# Patient Record
Sex: Female | Born: 1970 | Race: White | Hispanic: No | Marital: Married | Smoking: Never smoker
Health system: Southern US, Community
[De-identification: ages and names within clinical notes are randomized; demographics above are authoritative.]

## PROBLEM LIST (undated history)

## (undated) DIAGNOSIS — G47419 Narcolepsy without cataplexy: Secondary | ICD-10-CM

## (undated) DIAGNOSIS — F32A Depression, unspecified: Secondary | ICD-10-CM

## (undated) DIAGNOSIS — S0300XA Dislocation of jaw, unspecified side, initial encounter: Secondary | ICD-10-CM

## (undated) DIAGNOSIS — E669 Obesity, unspecified: Secondary | ICD-10-CM

## (undated) DIAGNOSIS — L709 Acne, unspecified: Secondary | ICD-10-CM

## (undated) DIAGNOSIS — N200 Calculus of kidney: Secondary | ICD-10-CM

## (undated) DIAGNOSIS — F419 Anxiety disorder, unspecified: Secondary | ICD-10-CM

## (undated) DIAGNOSIS — Z8489 Family history of other specified conditions: Secondary | ICD-10-CM

## (undated) DIAGNOSIS — L539 Erythematous condition, unspecified: Secondary | ICD-10-CM

## (undated) DIAGNOSIS — N393 Stress incontinence (female) (male): Secondary | ICD-10-CM

## (undated) DIAGNOSIS — E039 Hypothyroidism, unspecified: Secondary | ICD-10-CM

## (undated) DIAGNOSIS — F329 Major depressive disorder, single episode, unspecified: Secondary | ICD-10-CM

## (undated) DIAGNOSIS — I8390 Asymptomatic varicose veins of unspecified lower extremity: Secondary | ICD-10-CM

## (undated) DIAGNOSIS — L680 Hirsutism: Secondary | ICD-10-CM

## (undated) DIAGNOSIS — R61 Generalized hyperhidrosis: Secondary | ICD-10-CM

## (undated) DIAGNOSIS — Q892 Congenital malformations of other endocrine glands: Secondary | ICD-10-CM

## (undated) DIAGNOSIS — L920 Granuloma annulare: Secondary | ICD-10-CM

## (undated) HISTORY — DX: Depression, unspecified: F32.A

## (undated) HISTORY — DX: Granuloma annulare: L92.0

## (undated) HISTORY — DX: Narcolepsy without cataplexy: G47.419

## (undated) HISTORY — DX: Obesity, unspecified: E66.9

## (undated) HISTORY — DX: Major depressive disorder, single episode, unspecified: F32.9

## (undated) HISTORY — DX: Erythematous condition, unspecified: L53.9

## (undated) HISTORY — PX: KIDNEY STONE SURGERY: SHX686

## (undated) HISTORY — DX: Calculus of kidney: N20.0

## (undated) HISTORY — DX: Dislocation of jaw, unspecified side, initial encounter: S03.00XA

## (undated) HISTORY — DX: Hypothyroidism, unspecified: E03.9

## (undated) HISTORY — DX: Acne, unspecified: L70.9

## (undated) HISTORY — DX: Anxiety disorder, unspecified: F41.9

## (undated) HISTORY — DX: Asymptomatic varicose veins of unspecified lower extremity: I83.90

## (undated) HISTORY — DX: Generalized hyperhidrosis: R61

## (undated) HISTORY — DX: Congenital malformations of other endocrine glands: Q89.2

## (undated) HISTORY — DX: Stress incontinence (female) (male): N39.3

## (undated) HISTORY — DX: Hirsutism: L68.0

## (undated) SURGERY — Surgical Case
Anesthesia: *Unknown

---
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
## 1998-03-23 HISTORY — PX: OTHER SURGICAL HISTORY: SHX169

## 1998-12-07 ENCOUNTER — Emergency Department (HOSPITAL_COMMUNITY): Admission: EM | Admit: 1998-12-07 | Discharge: 1998-12-07 | Payer: Self-pay | Admitting: Emergency Medicine

## 1999-01-14 ENCOUNTER — Encounter: Payer: Self-pay | Admitting: Obstetrics and Gynecology

## 1999-01-14 ENCOUNTER — Ambulatory Visit (HOSPITAL_COMMUNITY): Admission: RE | Admit: 1999-01-14 | Discharge: 1999-01-14 | Payer: Self-pay | Admitting: Obstetrics and Gynecology

## 1999-01-21 ENCOUNTER — Encounter (INDEPENDENT_AMBULATORY_CARE_PROVIDER_SITE_OTHER): Payer: Self-pay | Admitting: Specialist

## 1999-01-21 ENCOUNTER — Other Ambulatory Visit: Admission: RE | Admit: 1999-01-21 | Discharge: 1999-01-21 | Payer: Self-pay | Admitting: Obstetrics and Gynecology

## 1999-04-23 ENCOUNTER — Encounter: Admission: RE | Admit: 1999-04-23 | Discharge: 1999-04-23 | Payer: Self-pay | Admitting: Family Medicine

## 1999-04-23 ENCOUNTER — Encounter: Payer: Self-pay | Admitting: Family Medicine

## 1999-04-29 ENCOUNTER — Encounter: Payer: Self-pay | Admitting: Family Medicine

## 1999-04-29 ENCOUNTER — Encounter: Admission: RE | Admit: 1999-04-29 | Discharge: 1999-04-29 | Payer: Self-pay | Admitting: Family Medicine

## 1999-06-13 ENCOUNTER — Ambulatory Visit (HOSPITAL_COMMUNITY): Admission: RE | Admit: 1999-06-13 | Discharge: 1999-06-13 | Payer: Self-pay | Admitting: Gastroenterology

## 1999-06-13 ENCOUNTER — Encounter: Payer: Self-pay | Admitting: Gastroenterology

## 1999-06-25 ENCOUNTER — Other Ambulatory Visit: Admission: RE | Admit: 1999-06-25 | Discharge: 1999-06-25 | Payer: Self-pay | Admitting: Otolaryngology

## 1999-11-11 ENCOUNTER — Encounter: Payer: Self-pay | Admitting: Obstetrics and Gynecology

## 1999-11-11 ENCOUNTER — Ambulatory Visit (HOSPITAL_COMMUNITY): Admission: RE | Admit: 1999-11-11 | Discharge: 1999-11-11 | Payer: Self-pay | Admitting: Obstetrics and Gynecology

## 2000-03-02 ENCOUNTER — Inpatient Hospital Stay (HOSPITAL_COMMUNITY): Admission: RE | Admit: 2000-03-02 | Discharge: 2000-03-02 | Payer: Self-pay | Admitting: Obstetrics and Gynecology

## 2000-03-02 ENCOUNTER — Encounter: Payer: Self-pay | Admitting: Obstetrics and Gynecology

## 2000-03-09 ENCOUNTER — Encounter (HOSPITAL_COMMUNITY): Admission: RE | Admit: 2000-03-09 | Discharge: 2000-04-02 | Payer: Self-pay | Admitting: Obstetrics and Gynecology

## 2000-04-02 ENCOUNTER — Inpatient Hospital Stay (HOSPITAL_COMMUNITY): Admission: AD | Admit: 2000-04-02 | Discharge: 2000-04-06 | Payer: Self-pay | Admitting: Obstetrics and Gynecology

## 2000-05-13 ENCOUNTER — Other Ambulatory Visit: Admission: RE | Admit: 2000-05-13 | Discharge: 2000-05-13 | Payer: Self-pay | Admitting: Obstetrics and Gynecology

## 2001-04-03 ENCOUNTER — Inpatient Hospital Stay (HOSPITAL_COMMUNITY): Admission: AD | Admit: 2001-04-03 | Discharge: 2001-04-03 | Payer: Self-pay | Admitting: Obstetrics and Gynecology

## 2001-04-07 ENCOUNTER — Encounter: Payer: Self-pay | Admitting: Obstetrics and Gynecology

## 2001-04-07 ENCOUNTER — Ambulatory Visit (HOSPITAL_COMMUNITY): Admission: RE | Admit: 2001-04-07 | Discharge: 2001-04-07 | Payer: Self-pay | Admitting: Obstetrics and Gynecology

## 2001-05-31 ENCOUNTER — Encounter: Payer: Self-pay | Admitting: Family Medicine

## 2001-05-31 ENCOUNTER — Encounter: Admission: RE | Admit: 2001-05-31 | Discharge: 2001-05-31 | Payer: Self-pay | Admitting: Family Medicine

## 2002-03-23 HISTORY — PX: TONSILLECTOMY: SUR1361

## 2002-07-24 ENCOUNTER — Other Ambulatory Visit: Admission: RE | Admit: 2002-07-24 | Discharge: 2002-07-24 | Payer: Self-pay | Admitting: Obstetrics and Gynecology

## 2002-08-10 ENCOUNTER — Encounter: Payer: Self-pay | Admitting: Otolaryngology

## 2002-08-10 ENCOUNTER — Encounter: Admission: RE | Admit: 2002-08-10 | Discharge: 2002-08-10 | Payer: Self-pay | Admitting: Otolaryngology

## 2002-08-22 DIAGNOSIS — Q892 Congenital malformations of other endocrine glands: Secondary | ICD-10-CM

## 2002-08-22 HISTORY — DX: Congenital malformations of other endocrine glands: Q89.2

## 2002-09-13 ENCOUNTER — Inpatient Hospital Stay (HOSPITAL_COMMUNITY): Admission: AD | Admit: 2002-09-13 | Discharge: 2002-09-16 | Payer: Self-pay | Admitting: Otolaryngology

## 2003-09-08 ENCOUNTER — Inpatient Hospital Stay (HOSPITAL_COMMUNITY): Admission: AD | Admit: 2003-09-08 | Discharge: 2003-09-08 | Payer: Self-pay | Admitting: Obstetrics and Gynecology

## 2003-09-12 ENCOUNTER — Inpatient Hospital Stay (HOSPITAL_COMMUNITY): Admission: AD | Admit: 2003-09-12 | Discharge: 2003-09-14 | Payer: Self-pay | Admitting: Obstetrics and Gynecology

## 2003-09-18 ENCOUNTER — Encounter: Admission: RE | Admit: 2003-09-18 | Discharge: 2003-10-18 | Payer: Self-pay | Admitting: Obstetrics and Gynecology

## 2004-01-17 ENCOUNTER — Other Ambulatory Visit: Admission: RE | Admit: 2004-01-17 | Discharge: 2004-01-17 | Payer: Self-pay | Admitting: Family Medicine

## 2005-04-21 ENCOUNTER — Other Ambulatory Visit: Admission: RE | Admit: 2005-04-21 | Discharge: 2005-04-21 | Payer: Self-pay | Admitting: Obstetrics and Gynecology

## 2005-05-07 ENCOUNTER — Encounter: Admission: RE | Admit: 2005-05-07 | Discharge: 2005-05-07 | Payer: Self-pay | Admitting: Obstetrics and Gynecology

## 2007-05-04 ENCOUNTER — Other Ambulatory Visit: Admission: RE | Admit: 2007-05-04 | Discharge: 2007-05-04 | Payer: Self-pay | Admitting: Family Medicine

## 2008-03-23 HISTORY — PX: VAGINAL PROLAPSE REPAIR: SHX830

## 2008-03-23 HISTORY — PX: ABDOMINAL HYSTERECTOMY: SHX81

## 2008-05-31 ENCOUNTER — Other Ambulatory Visit: Admission: RE | Admit: 2008-05-31 | Discharge: 2008-05-31 | Payer: Self-pay | Admitting: Family Medicine

## 2008-12-26 ENCOUNTER — Encounter (INDEPENDENT_AMBULATORY_CARE_PROVIDER_SITE_OTHER): Payer: Self-pay | Admitting: Obstetrics and Gynecology

## 2008-12-26 ENCOUNTER — Ambulatory Visit (HOSPITAL_COMMUNITY): Admission: RE | Admit: 2008-12-26 | Discharge: 2008-12-27 | Payer: Self-pay | Admitting: Obstetrics and Gynecology

## 2009-03-23 HISTORY — PX: OTHER SURGICAL HISTORY: SHX169

## 2009-03-26 ENCOUNTER — Encounter: Admission: RE | Admit: 2009-03-26 | Discharge: 2009-03-26 | Payer: Self-pay | Admitting: Obstetrics and Gynecology

## 2009-07-02 ENCOUNTER — Encounter: Admission: RE | Admit: 2009-07-02 | Discharge: 2009-07-02 | Payer: Self-pay | Admitting: Internal Medicine

## 2010-03-29 ENCOUNTER — Emergency Department (HOSPITAL_COMMUNITY)
Admission: EM | Admit: 2010-03-29 | Discharge: 2010-03-29 | Payer: Self-pay | Source: Home / Self Care | Admitting: Emergency Medicine

## 2010-04-07 LAB — URINE MICROSCOPIC-ADD ON

## 2010-04-07 LAB — URINALYSIS, ROUTINE W REFLEX MICROSCOPIC
Bilirubin Urine: NEGATIVE
Ketones, ur: NEGATIVE mg/dL
Leukocytes, UA: NEGATIVE
Nitrite: NEGATIVE
Protein, ur: 30 mg/dL — AB
Specific Gravity, Urine: 1.02 (ref 1.005–1.030)
Urine Glucose, Fasting: NEGATIVE mg/dL
Urobilinogen, UA: 0.2 mg/dL (ref 0.0–1.0)
pH: 7 (ref 5.0–8.0)

## 2010-04-07 LAB — URINE CULTURE
Colony Count: NO GROWTH
Culture  Setup Time: 201201071114
Culture: NO GROWTH

## 2010-04-07 LAB — GLUCOSE, CAPILLARY: Glucose-Capillary: 122 mg/dL — ABNORMAL HIGH (ref 70–99)

## 2010-04-13 ENCOUNTER — Encounter: Payer: Self-pay | Admitting: Internal Medicine

## 2010-06-26 LAB — CBC
HCT: 31.3 % — ABNORMAL LOW (ref 36.0–46.0)
HCT: 31.3 % — ABNORMAL LOW (ref 36.0–46.0)
HCT: 36.3 % (ref 36.0–46.0)
Hemoglobin: 10.7 g/dL — ABNORMAL LOW (ref 12.0–15.0)
Hemoglobin: 10.9 g/dL — ABNORMAL LOW (ref 12.0–15.0)
Hemoglobin: 12.2 g/dL (ref 12.0–15.0)
MCHC: 33.5 g/dL (ref 30.0–36.0)
MCHC: 34.1 g/dL (ref 30.0–36.0)
MCHC: 34.8 g/dL (ref 30.0–36.0)
MCV: 90.8 fL (ref 78.0–100.0)
MCV: 91.1 fL (ref 78.0–100.0)
MCV: 91.3 fL (ref 78.0–100.0)
Platelets: 190 10*3/uL (ref 150–400)
Platelets: 219 10*3/uL (ref 150–400)
Platelets: 240 10*3/uL (ref 150–400)
RBC: 3.43 MIL/uL — ABNORMAL LOW (ref 3.87–5.11)
RBC: 3.43 MIL/uL — ABNORMAL LOW (ref 3.87–5.11)
RBC: 4 MIL/uL (ref 3.87–5.11)
RDW: 13.1 % (ref 11.5–15.5)
RDW: 13.2 % (ref 11.5–15.5)
RDW: 13.3 % (ref 11.5–15.5)
WBC: 3.7 10*3/uL — ABNORMAL LOW (ref 4.0–10.5)
WBC: 8.2 10*3/uL (ref 4.0–10.5)
WBC: 9.4 10*3/uL (ref 4.0–10.5)

## 2010-06-26 LAB — TYPE AND SCREEN
ABO/RH(D): AB POS
Antibody Screen: NEGATIVE

## 2010-06-26 LAB — ABO/RH: ABO/RH(D): AB POS

## 2010-08-05 ENCOUNTER — Ambulatory Visit: Payer: Self-pay | Admitting: Internal Medicine

## 2010-08-08 NOTE — Op Note (Signed)
[facility]  Patient:    Vanessa Blanchard, Vanessa Blanchard                       MRN: [id number] Proc. Date: 04/03/00 Adm. Date:  [id number] Disc. Date: [id number] Attending:  Michaele Offer                           Operative Report  PREOPERATIVE DIAGNOSES:       1. Term pregnancy at 38 weeks, delivered.                               2. Polyhydramnios.                               3. Large for gestational age fetus.                               4. Failed induction.                               5. Arrest of descent.                               6. Maternal temperature in labor.  POSTOPERATIVE DIAGNOSES:      1. Term pregnancy at 38 weeks, delivered.                               2. Polyhydramnios.                               3. Large for gestational age fetus.                               4. Failed induction.                               5. Arrest of descent.                               6. Maternal temperature in labor.  OPERATION:                    Primary low transverse cesarean section.  SURGEON:                      Alvino Chapel, M.D.  ASSISTANT:  ANESTHESIA:                   Epidural.  ESTIMATED BLOOD LOSS:         800 cc.  URINE OUTPUT:                 500 cc.  IV FLUIDS:                    1200 cc LR.  FINDINGS:  There was a vigorous female infant who was vertex but had a transverse lie of the head. Weight was 8 pounds 2 ounces. Apgars were 9 and 10. Placenta was normal. Uterus, tubes, and ovaries were normal.  DESCRIPTION OF PROCEDURE:     Patient was taken to the operating room where epidural anesthesia was made adequate with additional dosing and tested with Allis clamp test. A Pfannenstiel skin incision was then made approximately 2 cm above the symphysis pubis and carried through to the underlying layer of fascia. The fascia was then nicked in the midline and the fascial incision elevated, both  superiorly and inferiorly, off the rectus muscles and dissected away with Bovie cautery. The rectus muscles were then separated in midline and the peritoneum identified, grasped with hemostats x 2, and entered sharply. Peritoneal incision was extended both superiorly and inferiorly with careful attention to avoid both bowel and bladder. The bladder blade was then inserted and the bladder flap created with Metzenbaum scissors. This was then pushed down off the lower uterine segment and the bladder blade replaced. The lower uterine segment was then incised in a transverse fashion and extended digitally. The infants head was then delivered atraumatically, the mouth and nose bulb suctioned. The remainder of the body was then delivered without difficulty and the cord clamped and cut with the infant handed to awaiting pediatricians. The placenta was then delivered manually and the uterus cleared of all clots and debris with a moist lap sponge. The uterine incision was then grasped at all aspects with ring clamps at each angle and, with one ring on each border, superiorly and inferiorly, the uterine incision was then repaired with 1-0 chromic in a running locked fashion with good hemostasis obtained. One small area of bleeding in the patients left angle was easily controlled with the angle suture initially. The gutters were then cleared of all clots and debris. The ovaries and tubes inspected and noted to be hemostatic. The uterine incision was once again found to be hemostatic. Therefore, all instruments and sponges were removed from the abdomen and the fascia was closed with 0 Vicryl in a running fashion. The skin was closed with staples. Sponge, lap, and needle counts were correct x 2 and the patient was taken to the recovery room in stable condition. DD:  04/03/00 TD:  04/04/00 Job: [id number]

## 2010-08-08 NOTE — Discharge Summary (Signed)
NAME:  Vanessa Blanchard, Vanessa Blanchard                          ACCOUNT NO.:  [id number]   MEDICAL RECORD NO.:  [id number]                   PATIENT TYPE:  INP   LOCATION:  9106                                 FACILITY:  [facility]   PHYSICIAN:  Huel Cote, M.D.              DATE OF BIRTH:  Dec 29, 1970   DATE OF ADMISSION:  09/12/2003  DATE OF DISCHARGE:  09/14/2003                                 DISCHARGE SUMMARY   DISCHARGE DIAGNOSES:  1. Term pregnancy at 40+ weeks delivered.  2. Status post vaginal birth after cesarean section.  3. Previous cesarean section for arrest of descent.   DISCHARGE MEDICATIONS:  1. Motrin 600 mg p.o. q.6h. p.r.n.  2. Percocet one to two tablets p.o. q.4h. p.r.n.   HOSPITAL COURSE:  The patient is a 40 year old G3 P1-0-1-1 who was admitted  at 40+ weeks gestation in labor for a trial of vaginal birth after cesarean  section.  Prenatal care had been complicated by a previous C-section for  arrest of descent with a desire for a trial of labor with this pregnancy.  The patient also had a case of shingles September 05, 2003 which was resolving.  Prenatal care otherwise had been uncomplicated.  She was admitted with  spontaneous rupture of membranes and labor with an admission exam of 6 cm.  Prenatal labs were as follows:  AB negative blood type, antibody negative,  RPR nonreactive, rubella immune, hepatitis B surface antigen negative, HIV  declined, GC negative, chlamydia negative, triple screen declined, 1-hour  Glucola 84, and group B strep negative.  The patient's past medical history  is significant for allergies and a positive antinuclear antibody.  She had a  history of allergies to TETRACYCLINE and did not tolerate STADOL well.  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  Past obstetrical history significant  for the  C-section in January 2002, an 8-pound 2-ounce infant.  In 2000 she  had a spontaneous miscarriage.  On physical exam on admission she was stable  with normal blood pressure and temperature.  Cardiac exam was regular rate  and rhythm.  Lungs were clear.  Abdomen was soft and nontender.  Cervical  exam was 6-7 cm per nursing.  She had a reassuring fetal heart tracing.  The  patient continued to progress and eventually reached complete dilation later  in the morning.  She had a low-grade temperature of 100.4.  Otherwise, fetal  heart rate remained reassuring.  She pushed once reaching complete dilation  for approximately 2 hours with a normal spontaneous vaginal delivery of a  vigorous female infant over a small second degree laceration.  Apgars were 9  and 9, weight was 7 pounds 14 ounces.  Placenta delivered spontaneously.  She had a second  degree laceration which was repaired with 3-0 Vicryl.  She  did very well postpartum and on postpartum day #2 was felt stable for  discharge home with medications, and follow-up in 6 weeks for her postpartum  exam.                                               Huel Cote, M.D.    KR/MEDQ  D:  09/14/2003  T:  09/15/2003  Job:  [id number]

## 2010-08-08 NOTE — Discharge Summary (Signed)
[facility]  Patient:    Vanessa Blanchard, Vanessa Blanchard                       MRN: [id number] Adm. Date:  [id number] Disc. Date: [id number] Attending:  Michaele Offer                           Discharge Summary  DISCHARGE DIAGNOSES:          1. Term pregnancy at 38-1/7 weeks, delivered.                               2. Polyhydramnios.                               3. Large for gestational age.                               4. Antiphospholipid positive.                               5. Arrest of descent.                               6. Maternal fever.                               7. Status post primary low transverse                                  cesarean section.  DISCHARGE MEDICATIONS:        1. Motrin 600 mg p.o. every six hours p.r.n.                               2. Percocet 1-2 tablets p.o. every four hours                                  p.r.n.                               3. Prenatal vitamins 1 p.o. daily.  FOLLOW-UP:                    The patient is to follow up in two weeks for an incision check and in six weeks for her routine postpartum exam.  HOSPITAL COURSE:              The patient is a 40 year old G2, P0 0 1 0 who was admitted at 38-1/7 weeks for induction, given a finding of an LGA infant, polyhydramnios, and a pregnancy complicated by antiphospholipid syndrome.  The patient had been treated with baby aspirin her entire pregnancy.  Otherwise, pregnancy proceeded without complication.  There was good fetal movement, no leakage fluid, and no vaginal bleeding on admission.  PRENATAL LABORATORY DATA:     AB positive, antibody negative, RPR negative, rubella  immune.  Hepatitis B surface antigen negative.  HIV negative.  GC negative.  Chlamydia negative.  GBS negative.  PAST OBSTETRICAL HISTORY:     The patient had one miscarriage in August 2000.  PAST GYNECOLOGICAL HISTORY:   None.  PAST MEDICAL HISTORY:         History of anemia and  a history of a kidney stone.  PAST SURGICAL HISTORY:        She had TMJ surgery.  ALLERGIES:                    TETRACYCLINE sensitive.  MEDICATIONS:                  1. Prenatal vitamins.                               2. Baby aspirin.  PHYSICAL EXAMINATION:  VITAL SIGNS:                  On admission, the patients blood pressure was 116/74 over 140-90.  NST was reactive.  Contractions were irregular.  CARDIAC:                      Regular rate and rhythm.  LUNGS:                        Clear.  ABDOMEN:                      Gravid and nontender.  EFW was 7-1/2 pounds. Cervix was 50% effaced, 1+ cm dilated, and the head was at a -3 station.  HOSPITAL COURSE:              The patient was counseled for her trial of induction; however, given the ballotable station of the head, understood that we would proceed with Pitocin and see if we could improve fetal station.  The patient remained on Pitocin throughout most of the day.  Her cervix did become anterior and the station improved to a -2; however, was still slightly moveable with the polyhydramnios.  For this reason, the membranes were ruptured in a controlled fashion with a small fetal scalp electrode and a controlled release of her fluid was obtained with the improvement of fetal station to a -2 station against the cervix.  After rupture of membranes, the patient became very uncomfortable.  She received one dose of IV medication with Stadol.  Her exam progressed to 70% effaced, 2-3 cm, and a -1 station and at that point the patient requested and received an epidural anesthesia.  She continued to progress throughout the night, reached complete dilation early in the morning on April 03, 2000.  The patient began pushing at approximately 9:30 a.m.  The patient then had a mild temperature to 101.4 and was begun on IV Unasyn.  The patient pushed well for approximately four hours; however, had minimal progression of the fetal station,  never going beyond a +1 station with pushing.  The head felt to be in a deep transverse lie.  Options were discussed with the patient including cesarean section and forceps delivery. The patient was counseled against forceps delivery, given the relatively high station with transverse lie and agreed to proceed with cesarean section.  She then underwent a low transverse cesarean section without difficulty and was delivered of a viable female infant from the  transverse lie.  Apgars were 9 and 10.  Weight was 8 pounds 2 ounces.  She tolerated the procedure well and was admitted for routine postop care.  She was continued on Unasyn for 24 hours; however, quickly defervesced after delivery and remained afebrile throughout her hospital course.  On postop day #3, she was doing very well.  Her pain was controlled.  She was tolerating a regular diet, afebrile, with stable vital signs.  Her incision was well-approximated with Steri-Strips.  Therefore, she was discharged to home with follow-up and medications as previously stated. DD:  04/06/00 TD:  04/06/00 Job: [id number]

## 2010-08-12 ENCOUNTER — Ambulatory Visit (INDEPENDENT_AMBULATORY_CARE_PROVIDER_SITE_OTHER): Payer: PRIVATE HEALTH INSURANCE | Admitting: Internal Medicine

## 2010-08-12 DIAGNOSIS — E039 Hypothyroidism, unspecified: Secondary | ICD-10-CM

## 2010-08-12 DIAGNOSIS — R635 Abnormal weight gain: Secondary | ICD-10-CM

## 2010-08-12 DIAGNOSIS — F411 Generalized anxiety disorder: Secondary | ICD-10-CM

## 2010-08-14 ENCOUNTER — Encounter: Payer: Self-pay | Admitting: Internal Medicine

## 2011-03-10 ENCOUNTER — Other Ambulatory Visit: Payer: Self-pay | Admitting: Dermatology

## 2011-12-28 ENCOUNTER — Encounter: Payer: PRIVATE HEALTH INSURANCE | Admitting: Internal Medicine

## 2012-01-12 ENCOUNTER — Other Ambulatory Visit: Payer: Self-pay | Admitting: Dermatology

## 2012-03-10 DIAGNOSIS — G479 Sleep disorder, unspecified: Secondary | ICD-10-CM

## 2012-03-10 DIAGNOSIS — R41840 Attention and concentration deficit: Secondary | ICD-10-CM

## 2012-03-10 DIAGNOSIS — F411 Generalized anxiety disorder: Secondary | ICD-10-CM

## 2012-03-10 DIAGNOSIS — R413 Other amnesia: Secondary | ICD-10-CM

## 2012-03-22 DIAGNOSIS — G479 Sleep disorder, unspecified: Secondary | ICD-10-CM

## 2012-03-22 DIAGNOSIS — R41840 Attention and concentration deficit: Secondary | ICD-10-CM

## 2012-03-22 DIAGNOSIS — F411 Generalized anxiety disorder: Secondary | ICD-10-CM

## 2012-03-22 DIAGNOSIS — R413 Other amnesia: Secondary | ICD-10-CM

## 2012-06-28 ENCOUNTER — Other Ambulatory Visit: Payer: Self-pay | Admitting: Neurology

## 2012-06-29 ENCOUNTER — Encounter: Payer: Self-pay | Admitting: Neurology

## 2012-06-29 LAB — COMPREHENSIVE METABOLIC PANEL
ALT: 12 IU/L (ref 0–32)
AST: 14 IU/L (ref 0–40)
Albumin/Globulin Ratio: 1.6 (ref 1.1–2.5)
Alkaline Phosphatase: 76 IU/L (ref 39–117)
Calcium: 9.6 mg/dL (ref 8.7–10.2)
GFR calc non Af Amer: 106 mL/min/{1.73_m2} (ref >59)
Potassium: 4.8 mmol/L (ref 3.5–5.2)
Sodium: 138 mmol/L (ref 134–144)

## 2012-06-29 LAB — CBC
Hemoglobin: 12.8 g/dL (ref 11.1–15.9)
MCHC: 32.8 g/dL (ref 31.5–35.7)
Platelets: 291 10*3/uL (ref 155–379)
RDW: 13.5 % (ref 12.3–15.4)

## 2012-06-29 LAB — CK TOTAL AND CKMB (NOT AT ARMC)
CK-MB Index: 1.8 ng/mL (ref 0.0–2.9)
Total CK: 78 U/L (ref 24–173)

## 2012-06-30 ENCOUNTER — Telehealth: Payer: Self-pay | Admitting: *Deleted

## 2012-06-30 NOTE — Telephone Encounter (Signed)
patient requesting lab results

## 2012-06-30 NOTE — Telephone Encounter (Signed)
Please call patient with normal lab results

## 2012-06-30 NOTE — Telephone Encounter (Signed)
Labs are not in my result box- were they done and released???

## 2012-07-01 ENCOUNTER — Other Ambulatory Visit: Payer: Self-pay | Admitting: Neurology

## 2012-07-01 ENCOUNTER — Telehealth: Payer: Self-pay | Admitting: Neurology

## 2012-07-01 DIAGNOSIS — G2581 Restless legs syndrome: Secondary | ICD-10-CM

## 2012-07-01 DIAGNOSIS — R208 Other disturbances of skin sensation: Secondary | ICD-10-CM

## 2012-07-01 NOTE — Telephone Encounter (Signed)
Patient's PCP wanted to know if a TSH and T4 level were done at pt visit on 06-28-12.  Thyroid testing was not done, I will call [facility] on Monday to see if they still have sample.  Patient was called and notified.

## 2012-07-01 NOTE — Telephone Encounter (Signed)
There is an order for T4- TSH in the system . I misunderstood Amy she told  me that Dr. Timothy Lasso would like [place] to order these tests--- and not the patient asked about it !! I am sorry , i was supposed to have ordered these and they didn't get properly done.

## 2012-07-01 NOTE — Telephone Encounter (Signed)
Called patient with normal labs, mailed results to patient and per pt's request, faxed to Obgyn, pcp

## 2012-07-04 NOTE — Telephone Encounter (Signed)
[facility] was called and additional TSH and T4 ordered.  Patient notified.

## 2012-07-05 LAB — T4 AND TSH: T4, Total: 8.2 ug/dL (ref 4.5–12.0)

## 2012-07-07 NOTE — Progress Notes (Signed)
Quick Note:  pls call pt and advise her that her TSH was slightly high at 4.98 with the upper limit of normal being 4.5. Pls have her discuss this with her PCP, thx Huston Foley, MD, PhD [facility] ([facility]  ______

## 2012-07-12 ENCOUNTER — Telehealth: Payer: Self-pay | Admitting: *Deleted

## 2012-07-12 NOTE — Telephone Encounter (Signed)
Message copied by Monico Blitz on Tue Jul 12, 2012  8:48 AM ------      Message from: Huston Foley      Created: Thu Jul 07, 2012 10:27 PM       pls call pt and advise her that her TSH was slightly high at 4.98 with the upper limit of normal being 4.5. Pls have her discuss this with her PCP, thx      Huston Foley, MD, PhD      [facility] ([facility]       ------

## 2012-07-21 ENCOUNTER — Ambulatory Visit: Admit: 2012-07-21 | Payer: PRIVATE HEALTH INSURANCE | Admitting: Obstetrics and Gynecology

## 2012-07-21 SURGERY — SALPINGO-OOPHORECTOMY, LAPAROSCOPIC
Anesthesia: General | Laterality: Bilateral

## 2012-08-02 ENCOUNTER — Encounter: Payer: Self-pay | Admitting: Neurology

## 2012-08-03 ENCOUNTER — Ambulatory Visit (INDEPENDENT_AMBULATORY_CARE_PROVIDER_SITE_OTHER): Payer: BC Managed Care – PPO | Admitting: Neurology

## 2012-08-03 ENCOUNTER — Encounter: Payer: Self-pay | Admitting: Neurology

## 2012-08-03 VITALS — BP 125/79 | HR 79 | Temp 99.7°F | Ht 65.0 in | Wt 198.0 lb

## 2012-08-03 DIAGNOSIS — G47411 Narcolepsy with cataplexy: Secondary | ICD-10-CM

## 2012-08-03 MED ORDER — AMPHETAMINE-DEXTROAMPHETAMINE 10 MG PO TABS: 10.0000 mg | ORAL_TABLET | Freq: Every day | ORAL | Status: DC

## 2012-08-03 MED ORDER — SODIUM OXYBATE 500 MG/ML PO SOLN: 4500.0000 mg | Freq: Two times a day (BID) | ORAL | Status: DC

## 2012-08-03 MED ORDER — ESCITALOPRAM OXALATE 20 MG PO TABS: 20.0000 mg | ORAL_TABLET | Freq: Every day | ORAL | Status: DC

## 2012-08-03 MED ORDER — ARMODAFINIL 250 MG PO TABS: 250.0000 mg | ORAL_TABLET | Freq: Every day | ORAL | Status: DC | PRN

## 2012-08-03 NOTE — Progress Notes (Signed)
[facility]  Provider:  Dr Vickey Huger Referring Provider: Earl Gala. Primary Care Physician:  Earl Gala, MD , [facility] .   Memory lapses and Hypersomnia.    HPI:  Vanessa Blanchard is a 42 y.o. female here as a referral from Dr. Earl Gala and Dr Timothy Lasso .  Vanessa Blanchard is followed by Dr. Timothy Lasso ,after she had seen Dr. Earl Gala at [place] before.  In 2010 she was after her sleep study with MSL T. diagnosed with narcolepsy .Dr. Earl Gala had prescribed Provigil 150 mg daily. She was also on lithium, Lexapro, Synthroid, Dexedrine.  The patient was referred originally for memory evaluation. She had a normal EKG, thyroid scan and other labs sedimentation rate was slightly elevated-  she continued to will endorsed the Epworth  sleepiness score at 14 points. I referred her at the time to Dr. Leonides Cave for memory and attention testing she was satisfied with the results but she appeared anxious and perhaps slightly depressed but had no organic memory loss.  The initial presentation in July 2013 was for amnestic spells. Patient spent into sudden 15 and night at her parents house and had trouble use the driveway she could not remember how to switch him to light of her car. She drove back to her parents house took a drink of water and then finally drove home. 14 days later in August 2013 she exchanged for her eyeglasses for contact lenses then still put on her eyeglasses and couldn't see due to the double refraction. Her next step was to and other contacts left of the disposable box and she tried to place him a buffer by mouth she had already worn. She was upset t for 45 minutes and very anxious about the sudden vision impairment until she realized what she had done.  Soon after,  she had placed milk and the oven,  instead of the refrigerator. She gave the  washing machine an extra dose of  laundry soap, forgot  The first dose already. .  She was  panicked when she once waited at a  traffic light,  and  didn't know the meaning of  Red versus Green light. She also wanted to go with her family to an amusement park and lost  The tickets to Vanessa Blanchard - she had misplaced them.  In October 2013, she visited  my office with her husband, who confirms that the patient is extremely sleepy absent-minded, " perhaps" but he has not seeing spells of forgetfulness and  he characterizes her as strictly distractible.    The patient reported also to have vivid dreams. 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  Since she had already taken modafinil with limited effect,  I changed her to Xyrem at the time. This is her first revisit since 04/07/2012 and  the patient is doing well on medication.     Epworth today 11 points on XYREM, with nuvigil only  used as a rescue med.  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 She denies any illegal drug use alcohol use tobacco he was she only drinks a cup of coffee on some mornings. Has never been a smoker or drug use.  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  07-12-2008, [facility],  She hat four  sleep onset REM - the REM latency was not documented. , mean sleep latency 13.9 minutes  .  Anxiety and depression, varicose veins, hypothyroidism, obesity, TMJ, hyperhidrosis and hirsutism and acne.  Surgical history:  one miscarriage in 2000 followed by a hysterectomy 2005 she had a vaginal prolapse and rectocele repair 2010 and she has tendinitis of the right Achillis tendon into thousand 11.  Previous MMSE testing is 28/30 points on 12/01/2011, previously at AST of the same day 17 points  Previous at worse 14 points and 12 points in generally 2014 more Chest them  or see a 30 / 30 January 16th 2014  Review of Systems: Out of a complete 14 system review, the patient complains of only the following symptoms, and all other reviewed systems are negative.  she feels she struggles  with memory , but has trouble to supervise her children's home work.  MOCA 30-30 .  History   Social History  . Marital Status: Married    Spouse Name: Acupuncturist    Number of Children: 2  . Years of Education: Assoc deg   Occupational History  . 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 Smoking status: Never Smoker   . Smokeless tobacco: Never Used  . Alcohol Use: No  . Drug Use: No  . Sexually Active: Yes -- Female partner(s)   Other Topics Concern  . Not on file   Social History Narrative   The patient is a Engineer, civil (consulting), RN NICU, now at home.  Caucasian female, married, has two children, two years of college and is not employed.    She denies any illegal drugs, alcohol , tobacco use, consumes one cup of coffee in mornings some days.          Family History  Problem Relation Age of Onset  . Hypothyroidism Mother   . Hypertension Mother   . Osteoarthritis Mother     bilateral knee replacements  . High Cholesterol Mother   . Obesity Mother   . Hypertension Father   . Hyperlipidemia Father   . High Cholesterol Father   . Obesity Father   . Acute lymphoblastic leukemia Son 3    in remission  . Hypothyroidism Son   . Hypothyroidism Other     Past Medical History  Diagnosis Date  . Nephrolithiasis     right  . Depression   . Hypothyroidism   . Female stress incontinence   . Granuloma annulare   . Vitamin D deficiency   . 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 Allergic rhinitis   . Narcolepsy     suspected without cataplexy,hypersomnia, MSLT with 4 SREM  . Anxiety   . Depression   . Varicose veins     BOTH LEGS, worsening,painfulk, leg fatigue extreme at times  . Obesity   . Erythema     nodosum, left lower leg  . TMJ (dislocation of  temporomandibular joint)   . Hyperhidrosis   . Hirsutism   . Acne     Past Surgical History  Procedure Laterality Date  . Abdominal hysterectomy  2010    partial, s/p prolapse  . Tonsillectomy  2004  . Cesarean section  2002  . Kidney stone surgery    . Tmj arthroplasty  1990  . Miscarriage  2000  . Vaginal prolapse repair  2010  . Tendonitis of right achilles  2011  Current Outpatient Prescriptions  Medication Sig Dispense Refill  . Armodafinil (NUVIGIL) 250 MG tablet Take 250 mg by mouth 2 (two) times daily.        . Calcium Carb-Cholecalciferol (CALCIUM 500 +D PO) Take 1 tablet by mouth daily.        . Cholecalciferol (VITAMIN D) 2000 UNITS tablet Take 2,000 Units by mouth daily.        Marland Kitchen dextroamphetamine (DEXEDRINE SPANSULE) 10 MG 24 hr capsule Take 10 mg by mouth at bedtime as needed.        Marland Kitchen levothyroxine (SYNTHROID, LEVOTHROID) 75 MCG tablet Take 75 mcg by mouth daily.        Marland Kitchen lithium 300 MG capsule Take 300 mg by mouth 2 (two) times daily with a meal.        . Magnesium 300 MG CAPS Take by mouth.        . Magnesium 400 MG CAPS Take 1 tablet by mouth daily.        . Multiple Vitamin (MULTIVITAMIN) tablet Take 1 tablet by mouth daily.        . Zinc 30 MG TABS Take 1 tablet by mouth daily.         No current facility-administered medications for this visit.    Allergies as of 08/03/2012 - Review Complete 08/02/2012  Allergen Reaction Noted  . Toradol (ketorolac tromethamine)  08/02/2012  . Septra (bactrim)  08/14/2010  . Provigil (modafinil) Palpitations 08/14/2010    Vitals: There were no vitals taken for this visit. Last Weight:  Wt Readings from Last 1 Encounters:  08/02/12 199 lb (90.266 kg)   Last Height:   Ht Readings from Last 1 Encounters:  08/02/12 5' 5.5" (1.664 m)   Vision Screening:  Vitals   Physical exam:  General: The patient is awake, alert and appears not in acute distress. The patient is well groomed. Head: Normocephalic,  atraumatic. Neck is supple. Mallampati2, neck circumference: 16 inches .  Cardiovascular:  Regular rate and rhythm without  murmurs or carotid bruit, and without distended neck veins. Respiratory: Lungs are clear to auscultation. Skin:  Without evidence of edema, or rash Trunk: BMI is still  elevated and patient  has normal posture.  Neurologic exam : The patient is awake and alert, oriented to place and time.  Memory subjective impaired - tests do not reveal this.  There is a normal attention span & concentration ability. Speech is fluent without  dysarthria, dysphonia or aphasia.  Mood and affect : she is worried.   Cranial nerves: Pupils are equal and briskly reactive to light. Extraocular movements  in vertical and horizontal planes intact and without nystagmus. Visual fields by finger perimetry are intact. Hearing to finger rub intact.  Facial sensation intact to fine touch. Facial motor strength is symmetric and tongue and uvula move midline.  Motor exam:   Normal tone and normal muscle bulk and symmetric normal strength in all extremities.  Sensory:  Fine touch, pinprick and vibration were tested in all extremities. Proprioception is tested in the upper extremities only. This was   normal.  Coordination: Rapid alternating movements in the fingers/hands is tested and normal. Finger-to-nose maneuver tested and normal without evidence of ataxia, dysmetria or tremor.  Gait and station: Patient walks without assistive device  . Strength within normal limits. Stance is stable and normal. Tandem gait is unfragmented. Romberg testing is  normal.  Deep tendon reflexes: in the  upper and lower extremities are symmetric and intact. Babinski  maneuver response is   downgoing.   Assessment:  After physical and neurologic examination, review of laboratory studies, imaging, neurophysiology testing and pre-existing records, assessment will be reviewed on the problem list.  Plan:  Treatment  plan and additional workup will be reviewed under Problem List.   Also the patient had reportedly tolerated Xyrem very well be GI and abdominal side effects (loose stools)  led to an evaluation by  an integrated health provider in [place]. He  tested her for food  allergies and apparently this test returned positive for several kinds of food.  She also had a pelvic and abdominal ultrasound which supposedly showed  a mass on the  right ovary,which  had  resolved in a reexamination. I would prefer the patient to continue the Xyrem medication at the current dose. I will also offer her some fluid acid and vitamin B medical foot supplements. Dr. Timothy Lasso is addressing for her hypothyroidism and intends to increase her daily goals of l-thyroxine 200 mcg by mouth daily . The patient will undergo a urine test  and underwent  CMET last visit

## 2012-08-03 NOTE — Patient Instructions (Addendum)
Sodium Oxybate oral solution What is this medicine? SODIUM OXYBATE (SOE dee um OX i bate) is used to treat excessive sleepiness and cataplexy in patients with narcolepsy. Cataplexy causes a sudden muscle weakness due to a strong emotional response. This medicine is not available in retail pharmacies. Your doctor will enroll you in a program that will provide the drug to you. This medicine may be used for other purposes; ask your health care provider or pharmacist if you have questions. What should I tell my health care provider before I take this medicine? They need to know if you have any of these conditions: -depression, psychosis, or other mood disorders -heart disease or high blood pressure -if you frequently drink alcohol containing beverages -if you have a history of drug or alcohol abuse -liver disease -lung disease or difficulty breathing -seizures -succinic semialdehyde dehydrogenase deficiency -thoughts of suicide -an unusual or allergic reaction to sodium oxybate, other medicines, foods, dyes, or preservatives -pregnant or trying to get pregnant -breast-feeding How should I use this medicine? Take this medicine by mouth. Follow the directions on the prescription label. Take this medicine on an empty stomach, at least 30 minutes before or 2 hours after food. Do not take with food. Do not take your medicine more often than directed. A special MedGuide will be given to you by the pharmacist with each prescription and refill. Be sure to read this information carefully each time. Talk to your pediatrician regarding the use of this medicine in children. Special care may be needed. Overdosage: If you think you have taken too much of this medicine contact a poison control center or emergency room at once. NOTE: This medicine is only for you. Do not share this medicine with others. What if I miss a dose? Skip the missed dose. If it is almost time for your next dose, take only that dose.  Allow at least two and one-half hours between each nightly dose. Do not take double or extra doses. What may interact with this medicine? Do not take this medicine with any of the following medications: -alcohol -barbiturates, like phenobarbital -medicines commonly used for anxiety, sedation or insomnia This medicine may also interact with the following medications: -bupropion -dronabinol or marijuana -medicines for psychosis or severe mood disturbances -muscle relaxants -other stimulants, although these are commonly used with sodium oxybate -prescription pain medicines, including tramadol This list may not describe all possible interactions. Give your health care provider a list of all the medicines, herbs, non-prescription drugs, or dietary supplements you use. Also tell them if you smoke, drink alcohol, or use illegal drugs. Some items may interact with your medicine. What should I watch for while using this medicine? The use of this medicine requires careful supervision. Visit your doctor or health care professional for regular checks on your progress. Do not suddenly stop taking this medicine if you have been taking it for a long time. Withdrawal symptoms may occur. Your doctor or health care professional may need to slowly stop your doses. This medicine may affect your concentration or function. Let your doctor or health care professional know if you have increased sleepiness or confusion during the day. This medicine causes sleep very quickly. You should only take this drug at bedtime, while in bed. Do not drive a car, operate heavy machinery or perform any activities that require mental alertness for at least 6 hours after taking this drug. Use extreme care in any such daily activities until you know how this medicine affects you.   Because alcohol may interfere with this medicine and may cause serious side effects, you must avoid alcohol-containing beverages while on this medicine. Do not  take this medicine along with sleep medicines or other drugs with strong sedative effects, serious side effects may occur. This medicine can be dangerous in overdose. If you take more than prescribed or take it by accident, get emergency medical help right away. What side effects may I notice from receiving this medicine? Side effects that you should report to your doctor or health care professional as soon as possible: -allergic reactions like skin rash, itching or hives, swelling of the face, lips, or tongue -breathing problems -confusion -fast, irregular heartbeat -increased blood pressure, particularly if you already have high blood pressure -memory loss -seizures -sleepwalking -tremors or shaking movements -urinary incontinence Side effects that usually do not require medical attention (report to your doctor or health care professional if they continue or are bothersome): -dizziness -drowsiness -headache -increased urination -nausea, vomiting or stomach upset -unusual dreams This list may not describe all possible side effects. Call your doctor for medical advice about side effects. You may report side effects to FDA at 1-800-FDA-1088. Where should I keep my medicine? Keep out of reach of children. This medicine can be abused. Keep your medicine in a safe place to protect it from theft. Do not share this medicine with anyone. Selling or giving away this medicine is dangerous and against the law. Store at room temperature between 15 and 30 degrees C (59 and 86 degrees F). Flush any unused medicines down the toilet. Do not use the medicine after the expiration date. NOTE: This sheet is a summary. It may not cover all possible information. If you have questions about this medicine, talk to your doctor, pharmacist, or health care provider.  2013, [employer]. (02/06/2008 10:05:19 AM)  

## 2012-08-08 ENCOUNTER — Telehealth: Payer: Self-pay

## 2012-08-08 MED ORDER — SODIUM OXYBATE 500 MG/ML PO SOLN: ORAL | Status: DC

## 2012-08-08 NOTE — Telephone Encounter (Signed)
Morrie Sheldon from [facility] called and said they need [place] to provide a new Rx for Xyrem.  The directions on the Rx sent and quantity are being questioned.  I will update and reprint Rx to be faxed to them at [phone].

## 2012-09-02 ENCOUNTER — Telehealth: Payer: Self-pay | Admitting: Neurology

## 2012-09-08 MED ORDER — ESCITALOPRAM OXALATE 20 MG PO TABS: 20.0000 mg | ORAL_TABLET | Freq: Every day | ORAL | Status: DC

## 2012-09-08 NOTE — Telephone Encounter (Signed)
Original message was never routed to anyone  (see included).  We just received this message today.  One year supply was sent to [facility] on Apr 07, 2012.  Another Rx for one year supply was written on 08/03/2012 at [facility].  I will resend Rx.  I called the patient.  Got no answer.  Left message saying her Rx was resent to local pharmacy.  I am unclear if she is just waiting to get meds in the mail from [facility] Order or if she needs a new Rx sent to mail order.  We do not have Mail Order pharmacy on file.  Asked her to call us back with that info if a new Rx needs to be sent to them.  I have not been contacted by the pharmacy.  We are updating phone system, unsure if they are leaving messages in VM that says do not leave message, as clinic no longer gets VM.

## 2012-09-08 NOTE — Telephone Encounter (Signed)
Pt called states her prescription with the mail order is still not completed and states the mail order is trying to reach [place] but no one is returning their call. Thanks

## 2012-09-09 ENCOUNTER — Telehealth: Payer: Self-pay

## 2012-09-09 MED ORDER — ESCITALOPRAM OXALATE 20 MG PO TABS: 20.0000 mg | ORAL_TABLET | Freq: Every day | ORAL | Status: DC

## 2012-09-09 NOTE — Telephone Encounter (Signed)
Message copied by Malachy Moan on Fri Sep 09, 2012 10:06 AM ------      Message from: Anice Paganini      Created: Fri Sep 09, 2012  9:42 AM      Regarding: Mail Order        Pt called and states she has picked up her prescription at [facility] but needs the prescription sent to mail order and also for refills this is for 90 day supply. Here is the information you need thanks            [facility]      [address], [address], [place] [address] .      Phone: [phone] Fax [phone]  ------

## 2012-10-06 ENCOUNTER — Other Ambulatory Visit: Payer: Self-pay

## 2012-10-06 DIAGNOSIS — Z1231 Encounter for screening mammogram for malignant neoplasm of breast: Secondary | ICD-10-CM

## 2012-11-28 ENCOUNTER — Ambulatory Visit
Admission: RE | Admit: 2012-11-28 | Discharge: 2012-11-28 | Disposition: A | Discharge status: Home / Self Care | Payer: BC Managed Care – PPO | Source: Ambulatory Visit

## 2012-11-28 DIAGNOSIS — Z1231 Encounter for screening mammogram for malignant neoplasm of breast: Secondary | ICD-10-CM

## 2013-01-11 ENCOUNTER — Other Ambulatory Visit: Payer: Self-pay | Admitting: Endocrinology

## 2013-01-11 DIAGNOSIS — E049 Nontoxic goiter, unspecified: Secondary | ICD-10-CM

## 2013-01-12 ENCOUNTER — Ambulatory Visit
Admission: RE | Admit: 2013-01-12 | Discharge: 2013-01-12 | Disposition: A | Discharge status: Home / Self Care | Payer: BC Managed Care – PPO | Source: Ambulatory Visit | Attending: Endocrinology | Admitting: Endocrinology

## 2013-01-12 DIAGNOSIS — E049 Nontoxic goiter, unspecified: Secondary | ICD-10-CM

## 2013-01-31 ENCOUNTER — Other Ambulatory Visit: Payer: Self-pay | Admitting: Dermatology

## 2013-02-22 ENCOUNTER — Ambulatory Visit: Payer: BC Managed Care – PPO | Admitting: Neurology

## 2013-03-09 ENCOUNTER — Telehealth: Payer: Self-pay

## 2013-03-09 NOTE — Telephone Encounter (Signed)
[employer] sent [place] a letter stating they have approved our request for coverage on Xyrem effective until 03/08/2015.  If there are any questions regarding this approval, they may be reached at [phone].

## 2013-04-14 ENCOUNTER — Encounter (INDEPENDENT_AMBULATORY_CARE_PROVIDER_SITE_OTHER): Payer: Self-pay

## 2013-04-14 ENCOUNTER — Encounter: Payer: Self-pay | Admitting: Neurology

## 2013-04-14 ENCOUNTER — Ambulatory Visit (INDEPENDENT_AMBULATORY_CARE_PROVIDER_SITE_OTHER): Payer: BC Managed Care – PPO | Admitting: Neurology

## 2013-04-14 VITALS — BP 121/73 | HR 77 | Resp 16 | Ht 66.0 in | Wt 211.0 lb

## 2013-04-14 DIAGNOSIS — G4710 Hypersomnia, unspecified: Secondary | ICD-10-CM | POA: Insufficient documentation

## 2013-04-14 NOTE — Progress Notes (Signed)
[facility]  Provider:  Dr Brett Fairy Referring Provider: Maxwell Caul. Primary Care Physician:  Maxwell Caul, MD , [facility] .   Memory lapses and Hypersomnia.    HPI:  Vanessa Blanchard is a 43 y.o. female here as a revisit from Dr Virgina Jock for hypersomnia with micro sleeps, narcolepsy .  Mrs. Vanessa Blanchard reports as her interval history that she has been pleased with finding out that her MRI of the brain was normal that she had no organic memory loss according to Dr. Wilhemina Cash detailed testing, and also her EEGs have not shown any seizure activity but brief periods of sleep of which she was not aware at the time. In terms of her daytime hypersomnia she has felt that Xyrem important ability to maintain wakefulness, but she did not like the side effects of dizziness almost vertiginous sensation. She also had trouble waking up for the second dorsal. She is meanwhile discontinued the medication but she takes still a stimulant Dexedrine as needed she has been placed on a higher dose of thyroid  Medication, and continues to take antidepressants. She feels that her mood is stable, today we performed a memory test inform of a Montral cognitive assessment test and the patient reached 28 of 30 points. The 2 points she missed were in the immediate recall category. She had no more of the confusional, disoriented spells that she had reported before, but sometimes still creates neologisms or paraphasic errors. She has noticed that when her apprehension means and when she feels fatigued she has still trouble to multitask or remain organized. She remains somewhat highly fatigued at baseline and endorsed the sleepiness score at 12 points and the fatigue severity score at 56 points- close to the maximum.      Last visit : Mrs. Vanessa Blanchard is followed by Dr. Virgina Jock . She has seen Dr Justine Null for a positive ANA, Dr Chalmers Cater endocrinology, for diabetes and hypothyroidism. 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   In 2010 she was after her  sleep study with MSLT. diagnosed with narcolepsy . Dr. Maxwell Caul had prescribed Provigil 150 mg daily. She was also on lithium, Lexapro, Synthroid, Dexedrine.  The patient was referred originally for memory evaluation. She had a normal EKG, thyroid scan and other labs sedimentation rate was slightly elevated-  she continued to endorse the Epworth  sleepiness score at 14 points.  I referred her at the time to Dr. Valentina Shaggy for memory and attention testing she was satisfied with the results but she appeared anxious and perhaps slightly depressed but had no organic memory loss.  The initial presentation in July 2013 was for amnestic spells. Patient spent into sudden 4 and night at her parents house and had trouble use the driveway she could not remember how to switch him to light of her car. She drove back to her parents house took a drink of water and then finally drove home. 14 days later in August 2013 she exchanged for her eyeglasses for contact lenses then still put on her eyeglasses and couldn't see due to the double refraction. Her next step was to and other contacts left of the disposable box and she tried to place him a buffer by mouth she had already worn. She was upset t for 45 minutes and very anxious about the sudden vision impairment until she realized what she had done.  Soon after,  she had placed milk and the oven,  instead of the refrigerator. She gave the  washing machine an extra dose of  laundry  soap, forgot  The first dose already. .  She was  panicked when she once waited at a  traffic light,  and didn't know the meaning of  Red versus Green light. She also wanted to go with her family to an amusement park and lost  The tickets to [place] - she had misplaced them.  In October 2013, she visited  my office with her husband, who confirms that the patient is extremely sleepy absent-minded, " perhaps" but he has not seeing spells of forgetfulness and  he characterizes her as strictly  distractible.    The patient reported also to have vivid dreams. 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  Since she had already taken modafinil with limited effect,  I changed her to Xyrem at the time. This is her first revisit since 04/07/2012 and  the patient is doing well on medication.      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  07-12-2008, [facility],  She hat four  sleep onset REM - the REM latency was not documented. , mean sleep latency 13.9 minutes  . Anxiety and depression, varicose veins, hypothyroidism, obesity, TMJ, hyperhidrosis and hirsutism and acne.  Surgical history:  one miscarriage in 2000, followed by a hysterectomy 2005, she had a vaginal prolapse and rectocele repair 2010, and she has tendinitis of the right Achillis tendon into thousand 11.  Previous MMSE testing is 28/30 points on 12/01/2011, previously at AST of the same day 17 points  Previous at worse 14 points and 12 points in generally 2014 more Chest them or see a 30 / 30 January 16th 2014  Review of Systems: Out of a complete 14 system review, the patient complains of only the following symptoms, and all other reviewed systems are negative.  she feels she struggles  with memory , but has trouble to supervise her children's home work.  MOCA 30-30 .  History   Social History  . Marital Status: Married    Spouse Name: Event organiser    Number of Children: 2  . Years of Education: Assoc deg   Occupational History  . 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 Smoking status: Never Smoker   . Smokeless tobacco: Never Used  . Alcohol  Use: No  . Drug Use: No  . Sexual Activity: Yes    Partners: Male   Other Topics Concern  . Not on file   Social History Narrative   The patient is a Marine scientist, RN NICU, now at home.  Caucasian female, married, has two children, two years of college and is not employed.    She denies any illegal drugs, alcohol , tobacco use, consumes one cup of coffee in mornings some days.          Family History  Problem Relation Age of Onset  . Hypothyroidism Mother   . Hypertension Mother   . Osteoarthritis Mother     bilateral knee replacements  . High Cholesterol Mother   . Obesity Mother   . Hypertension Father   . Hyperlipidemia Father   . High Cholesterol Father   .  Obesity Father   . Acute lymphoblastic leukemia Son 3    in remission  . Hypothyroidism Son   . Hypothyroidism Other     Past Medical History  Diagnosis Date  . Nephrolithiasis     right  . Depression   . Hypothyroidism   . Female stress incontinence   . Granuloma annulare   . Vitamin D deficiency   . 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 Allergic rhinitis   . Narcolepsy     suspected without cataplexy,hypersomnia, MSLT with 4 SREM  . Anxiety   . Depression   . Varicose veins     BOTH LEGS, worsening,painfulk, leg fatigue extreme at times  . Obesity   . Erythema     nodosum, left lower leg  . TMJ (dislocation of temporomandibular joint)   . Hyperhidrosis   . Hirsutism   . Acne     Past Surgical History  Procedure Laterality Date  . Abdominal hysterectomy  2010    partial, s/p prolapse  . Tonsillectomy  2004  . Cesarean section  2002  . Kidney stone surgery    . Tmj arthroplasty  1990  . Miscarriage  2000  . Vaginal prolapse repair  2010  . Tendonitis of right achilles  2011    Current Outpatient Prescriptions  Medication Sig Dispense Refill  . amphetamine-dextroamphetamine (ADDERALL) 10 MG tablet Take 1 tablet (10 mg total) by mouth daily.  30 tablet  0  . escitalopram (LEXAPRO) 20 MG tablet Take 1  tablet (20 mg total) by mouth daily.  90 tablet  3  . metFORMIN (GLUCOPHAGE) 500 MG tablet Take 500 mg by mouth daily.      . Sodium Oxybate 500 MG/ML SOLN Patient is to take 4.5gm po twice nightly (Please dispense one month supply per fill)  3 Bottle  5  . thyroid (ARMOUR) 130 MG tablet Take 130 mg by mouth daily.       No current facility-administered medications for this visit.    Allergies as of 04/14/2013 - Review Complete 04/14/2013  Allergen Reaction Noted  . Provigil [modafinil] Palpitations 08/14/2010    Vitals: BP 121/73  Pulse 77  Resp 16  Ht $R'5\' 6"'Gw$  (1.676 m)  Wt 211 lb (95.709 kg)  BMI 34.07 kg/m2 Last Weight:  Wt Readings from Last 1 Encounters:  04/14/13 211 lb (95.709 kg)   Last Height:   Ht Readings from Last 1 Encounters:  04/14/13 $RemoveB'5\' 6"'dEEYVcaB$  (1.676 m)   Vision Screening:  Vitals   Physical exam:  General: The patient is awake, alert and appears not in acute distress. The patient is well groomed. Head: Normocephalic, atraumatic. Neck is supple. Mallampati2, neck circumference: 16 inches .  Cardiovascular:  Regular rate and rhythm without  murmurs or carotid bruit, and without distended neck veins. Respiratory: Lungs are clear to auscultation. Skin:  Without evidence of edema, or rash Trunk: BMI is still  elevated and patient  has normal posture.  Neurologic exam : The patient is awake and alert, oriented to place and time.  Memory subjective impaired - tests do not reveal this.  There is a normal attention span & concentration ability. Speech is fluent without  dysarthria, dysphonia or aphasia.  Mood and affect : she is worried.   Cranial nerves: Pupils are equal and briskly reactive to light. Extraocular movements  in vertical and horizontal planes intact and without nystagmus. Visual fields by finger perimetry are intact. Hearing to finger rub intact.  Facial sensation intact to fine  touch. Facial motor strength is symmetric and tongue and uvula move  midline.  Motor exam:   Normal tone and normal muscle bulk and symmetric normal strength in all extremities.  Sensory:  Fine touch, pinprick and vibration were tested in all extremities. Proprioception is tested in the upper extremities only. This was   normal.  Coordination: Rapid alternating movements in the fingers/hands is tested and normal. Finger-to-nose maneuver tested and normal without evidence of ataxia, dysmetria or tremor.  Gait and station: Patient walks without assistive device  . Strength within normal limits. Stance is stable and normal. Tandem gait is unfragmented. Romberg testing is  normal.  Deep tendon reflexes: in the  upper and lower extremities are symmetric and intact. Babinski maneuver response is   downgoing.   Assessment:  After physical and neurologic examination, review of laboratory studies, imaging, neurophysiology testing and pre-existing records, assessment will be reviewed on the problem list. Hypersomnia , defined by MSLT as narcolepsy. dexadrine works [place] for her.  I will order HLA testing for narcolepsy autoimmune trait. Marland Kitchen

## 2013-04-19 ENCOUNTER — Telehealth: Payer: Self-pay | Admitting: Neurology

## 2013-04-19 NOTE — Telephone Encounter (Signed)
Pt called and is asking about the diagnosis code for the narcolepsy test that pt had drawn at Dr. Willeen CassBalan's office ([facility]).  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  She will let them know at Dr. Willeen CassBalan's office.

## 2013-11-14 ENCOUNTER — Other Ambulatory Visit: Payer: Self-pay

## 2013-11-14 MED ORDER — ESCITALOPRAM OXALATE 20 MG PO TABS: 20.0000 mg | ORAL_TABLET | Freq: Every day | ORAL | Status: DC

## 2013-11-20 ENCOUNTER — Telehealth: Payer: Self-pay | Admitting: *Deleted

## 2013-11-20 NOTE — Telephone Encounter (Signed)
Pt calling needing refill on lexapro.  It looks like prescription done 11-14-13.  Her letter that she received notes 11-09-13.  I gave her the reorder , she will call her mail order and see.  If problem will call back.  I told her I would relay to [email or web address] as FYI.

## 2014-01-22 ENCOUNTER — Encounter: Payer: Self-pay | Admitting: Neurology

## 2015-08-26 ENCOUNTER — Ambulatory Visit: Payer: Self-pay | Admitting: Adult Health

## 2015-08-29 ENCOUNTER — Ambulatory Visit (INDEPENDENT_AMBULATORY_CARE_PROVIDER_SITE_OTHER): Payer: 59 | Admitting: Adult Health

## 2015-08-29 ENCOUNTER — Encounter: Payer: Self-pay | Admitting: Adult Health

## 2015-08-29 VITALS — BP 126/62 | HR 75 | Ht 66.0 in | Wt 199.8 lb

## 2015-08-29 DIAGNOSIS — G47419 Narcolepsy without cataplexy: Secondary | ICD-10-CM | POA: Diagnosis not present

## 2015-08-29 DIAGNOSIS — R8290 Unspecified abnormal findings in urine: Secondary | ICD-10-CM | POA: Diagnosis not present

## 2015-08-29 DIAGNOSIS — F32A Depression, unspecified: Secondary | ICD-10-CM

## 2015-08-29 DIAGNOSIS — F329 Major depressive disorder, single episode, unspecified: Secondary | ICD-10-CM

## 2015-08-29 DIAGNOSIS — F411 Generalized anxiety disorder: Secondary | ICD-10-CM | POA: Diagnosis not present

## 2015-08-29 MED ORDER — LISDEXAMFETAMINE DIMESYLATE 20 MG PO CAPS: 20.0000 mg | ORAL_CAPSULE | Freq: Every day | ORAL | Status: DC

## 2015-08-29 NOTE — Progress Notes (Signed)
PATIENT: Vanessa JakschKatie J Blanchard DOB: 1970-06-11  REASON FOR VISIT: follow up- hypersomnia, narcolepsy HISTORY FROM: patient  HISTORY OF PRESENT ILLNESS: Vanessa Blanchard is a 45 year old female with a history of hypersomnia with micro-sleep attacks and narcolepsy. The patient has not been seen in this office in 2-1/2 years. She returns today for follow-up. The patient states that she has been doing well hence the reason she is not been back to the office. She states that in the last 6 months she has noticed more changes with her mood, sleepiness and memory. She is seeing a counselor who suggested she follow up with our office. She reports that her primary care recently increased her Lexapro to 30 mg. As of now she has not noticed an improvement she has been on this dose for approximately one month. She states in regard to her mood she is very emotional. She states that she feels as if she is on a roller coaster. One day she is extremely low and the next day she is on a high. She feels that her emotions are due to her fatigue. She has been given Xanax but she takes it infrequently. When she does it is beneficial. In the past she has had a evaluation by Dr. Leonides CaveZelson. Patient states she has began stumbling over her words again. Sometimes she will interchange the wrong word. She also states that she'll be looking for her phone and it will be in her hand. In the past she has been on Xyrem but stopped this medication because she was uncomfortable using it. She states that she is home alone at night and has small children. She has also been on modafinil with no benefit. She is also tried Adderall. She returns today for an evaluation.  While in the office the patient reports that she does have a history of kidney stones. She states that it typically starts with an abnormal urine odor. She states that she has been noticing a urine odor for a couple days. She is questioning if I can complete a urinalysis today in  office.  HISTORY 04/14/13 ([place]): Vanessa JakschKatie J Albaugh is a 45 y.o. female here as a revisit from Dr Timothy Lassousso for hypersomnia with micro sleeps, narcolepsy .  Mrs. Cecelia Blanchard reports as her interval history that she has been pleased with finding out that her MRI of the brain was normal that she had no organic memory loss according to Dr. Clifton JamesFeldman's detailed testing, and also her EEGs have not shown any seizure activity but brief periods of sleep of which she was not aware at the time. In terms of her daytime hypersomnia she has felt that Xyrem important ability to maintain wakefulness, but she did not like the side effects of dizziness almost vertiginous sensation. She also had trouble waking up for the second dorsal. She is meanwhile discontinued the medication but she takes still a stimulant Dexedrine as needed she has been placed on a higher dose of thyroid Medication, and continues to take antidepressants. She feels that her mood is stable, today we performed a memory test inform of a Montral cognitive assessment test and the patient reached 28 of 30 points. The 2 points she missed were in the immediate recall category. She had no more of the confusional, disoriented spells that she had reported before, but sometimes still creates neologisms or paraphasic errors. She has noticed that when her apprehension means and when she feels fatigued she has still trouble to multitask or remain organized. She remains somewhat  highly fatigued at baseline and endorsed the sleepiness score at 12 points and the fatigue severity score at 56 points- close to the maximum.      Last visit : Mrs. Delton Coombes is followed by Dr. Timothy Lasso . She has seen Dr Phylliss Bob for a positive ANA, Dr Talmage Nap endocrinology, for diabetes and hypothyroidism. 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  In 2010 she was after her sleep study with MSLT. diagnosed with narcolepsy . Dr. Earl Gala had prescribed Provigil 150 mg daily. She was also on lithium, Lexapro, Synthroid,  Dexedrine. The patient was referred originally for memory evaluation. She had a normal EKG, thyroid scan and other labs sedimentation rate was slightly elevated- she continued to endorse the Epworth sleepiness score at 14 points. I referred her at the time to Dr. Leonides Cave for memory and attention testing she was satisfied with the results but she appeared anxious and perhaps slightly depressed but had no organic memory loss.  The initial presentation in July 2013 was for amnestic spells. Patient spent into sudden 13 and night at her parents house and had trouble use the driveway she could not remember how to switch him to light of her car. She drove back to her parents house took a drink of water and then finally drove home. 14 days later in August 2013 she exchanged for her eyeglasses for contact lenses then still put on her eyeglasses and couldn't see due to the double refraction. Her next step was to and other contacts left of the disposable box and she tried to place him a buffer by mouth she had already worn. She was upset t for 45 minutes and very anxious about the sudden vision impairment until she realized what she had done.  Soon after, she had placed milk and the oven, instead of the refrigerator. She gave the washing machine an extra dose of laundry soap, forgot The first dose already. . She was panicked when she once waited at a traffic light, and didn't know the meaning of Red versus Green light. She also wanted to go with her family to an amusement park and lost The tickets to [place] - she had misplaced them. In October 2013, she visited my office with her husband, who confirms that the patient is extremely sleepy absent-minded, " perhaps" but he has not seeing spells of forgetfulness and he characterizes her as strictly distractible.   The patient reported also to have vivid dreams. She also spoke about micro- sleep attacks , presenting as being absent minded, sleep  walking, sleep talking, and reports sleep paralysis. Since she had already taken modafinil with limited effect, I changed her to Xyrem at the time. This is her first revisit since 04/07/2012 and the patient is doing well on medication.   REVIEW OF SYSTEMS: Out of a complete 14 system review of symptoms, the patient complains only of the following symptoms, and all other reviewed systems are negative.  Fatigue, heat intolerance, excessive eating, flushing, trouble swallowing, constipation, diarrhea, frequent waking, daytime sleepiness, sleep talking, sleep walking, acting out dreams, moles, walking difficulty, back pain, joint pain, urgency, environmental allergies, bruise/bleed easily, memory loss, headache, speech difficulty, weakness, decreased concentration, depression, nervous/anxious  ALLERGIES: Allergies  Allergen Reactions  . Provigil [Modafinil] Palpitations    Low dose of 150 mg was tolerated , 250 mg was not.     HOME MEDICATIONS: Outpatient Prescriptions Prior to Visit  Medication Sig Dispense Refill  . escitalopram (LEXAPRO) 20 MG tablet Take 1 tablet (20 mg total) by mouth daily. (  Patient taking differently: Take 30 mg by mouth daily. ) 90 tablet 1  . Sodium Oxybate 500 MG/ML SOLN Patient is to take 4.5gm po twice nightly (Please dispense one month supply per fill) 3 Bottle 5  . amphetamine-dextroamphetamine (ADDERALL) 10 MG tablet Take 1 tablet (10 mg total) by mouth daily. (Patient taking differently: Take 10 mg by mouth daily as needed. ) 30 tablet 0  . thyroid (ARMOUR) 130 MG tablet Take 130 mg by mouth daily.    . metFORMIN (GLUCOPHAGE) 500 MG tablet Take 500 mg by mouth daily. Reported on 08/29/2015     No facility-administered medications prior to visit.    PAST MEDICAL HISTORY: Past Medical History  Diagnosis Date  . Nephrolithiasis     right  . Depression   . Hypothyroidism   . Female stress incontinence   . Granuloma annulare   . Vitamin D deficiency   .  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 Allergic rhinitis   . Narcolepsy     suspected without cataplexy,hypersomnia, MSLT with 4 SREM  . Anxiety   . Depression   . Varicose veins     BOTH LEGS, worsening,painfulk, leg fatigue extreme at times  . Obesity   . Erythema     nodosum, left lower leg  . TMJ (dislocation of temporomandibular joint)   . Hyperhidrosis   . Hirsutism   . Acne     PAST SURGICAL HISTORY: Past Surgical History  Procedure Laterality Date  . Abdominal hysterectomy  2010    partial, s/p prolapse  . Tonsillectomy  2004  . Cesarean section  2002  . Kidney stone surgery    . Tmj arthroplasty  1990  . Miscarriage  2000  . Vaginal prolapse repair  2010  . Tendonitis of right achilles  2011    FAMILY HISTORY: Family History  Problem Relation Age of Onset  . Hypothyroidism Mother   . Hypertension Mother   . Osteoarthritis Mother     bilateral knee replacements  . High Cholesterol Mother   . Obesity Mother   . Hypertension Father   . Hyperlipidemia Father   . High Cholesterol Father   . Obesity Father   . Acute lymphoblastic leukemia Son 3    in remission  . Hypothyroidism Son   . Hypothyroidism Other     SOCIAL HISTORY: Social History   Social History  . Marital Status: Married    Spouse Name: Acupuncturist  . Number of Children: 2  . Years of Education: Assoc deg   Occupational History  . 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 Smoking status: Never Smoker   . Smokeless tobacco: Never Used  . Alcohol Use: No  . Drug Use: No  . Sexual Activity:    Partners: Male   Other Topics Concern  . Not on file   Social History Narrative   The patient is a Engineer, civil (consulting), RN NICU, now at home.  Caucasian female, married, has two children, two years of college and is not employed.    She denies any illegal drugs, alcohol , tobacco use, consumes one cup of coffee in mornings some days.            PHYSICAL EXAM  Filed Vitals:   08/29/15 1429   BP: 126/62  Pulse: 75  Height: 5\' 6"  (1.676 m)  Weight: 199 lb 12.8 oz (90.629 kg)   Body mass index is 32.26 kg/(m^2).  Generalized: Well  developed, in no acute distress   Neurological examination  Mentation: Alert oriented to time, place, history taking. Follows all commands speech and language fluent Cranial nerve II-XII: Pupils were equal round reactive to light. Extraocular movements were full, visual field were full on confrontational test. Facial sensation and strength were normal. Uvula tongue midline. Head turning and shoulder shrug  were normal and symmetric. Motor: The motor testing reveals 5 over 5 strength of all 4 extremities. Good symmetric motor tone is noted throughout.  Sensory: Sensory testing is intact to soft touch on all 4 extremities. No evidence of extinction is noted.  Coordination: Cerebellar testing reveals good finger-nose-finger and heel-to-shin bilaterally.  Gait and station: Gait is normal.  Reflexes: Deep tendon reflexes are symmetric and normal bilaterally.   DIAGNOSTIC DATA (LABS, IMAGING, TESTING) - I reviewed patient records, labs, notes, testing and imaging myself where available.   Lab work from primary care:   BUN 15, creatinine 0.7, sodium 138, potassium 4.8, chloride 110, AST 15, ALT 14, WBC 5.1, RBC 4.3, hemoglobin 13.1, hematocrit 39.4, platelets 207  EKG: Normal   ASSESSMENT AND PLAN 45 y.o. year old female  has a past medical history of Nephrolithiasis; Depression; Hypothyroidism; Female stress incontinence; Granuloma annulare; Vitamin D deficiency; Thyroglossal cyst (08/2002); Allergic rhinitis; Narcolepsy; Anxiety; Depression; Varicose veins; Obesity; Erythema; TMJ (dislocation of temporomandibular joint); Hyperhidrosis; Hirsutism; and Acne. here with:  1. Narcolepsy 2. Anxiety 3. Depression 4. Urine odor  The patient is experiencing more sleepiness as well as mood changes. Her PCP recently increased Lexapro to 30 mg daily. I will  try the patient on Vyvanse. I consulted with Dr. Vickey Huger and she agrees with this. She will start Vyvanse 20 mg daily. I did advise that Vyvanse and Lexapro can increase her risk for serotonin syndrome. I explained the risk associated with serotonin syndrome. Patient verbalized understanding. I will also check a urinalysis today in regards to her urine odor. She will follow-up in one month or sooner if needed.   Butch Penny, MSN, NP-C 08/29/2015, 2:39 PM [facility] [address] [place], [place] [address] [phone]

## 2015-08-29 NOTE — Patient Instructions (Signed)
Try Vyvanse 20 mg daily UA today If your symptoms worsen or you develop new symptoms please let us know.   Please be aware that Vyvanse and Lexapro can cause SEROTONIN SYNDROME: Symptoms of this condition include (but are not limited to):  Agitation or restlessness, confusion, rapid heart rate and high blood pressure, dilated pupils, loss of muscle coordination or twitching muscles, muscle rigidity/stiffness, sweating and/or flushing, diarrhea, headache, shivering, goose bumps. If you have any of these symptoms you may have to stop the medication. Call your health care provider immediately.  Severe serotonin syndrome can be life-threatening emergency. Signs and symptoms of a severe reaction may include: high fever, seizures, irregular heartbeat, unconsciousness or altered level of awareness or personality changes.  If you have any of these new symptoms, call 911 or have someone take you to the emergency room.

## 2015-08-30 ENCOUNTER — Telehealth: Payer: Self-pay | Admitting: Adult Health

## 2015-08-30 ENCOUNTER — Telehealth: Payer: Self-pay | Admitting: *Deleted

## 2015-08-30 LAB — URINALYSIS
Bilirubin, UA: NEGATIVE
Glucose, UA: NEGATIVE
LEUKOCYTES UA: NEGATIVE
NITRITE UA: POSITIVE — AB
PH UA: 5.5 (ref 5.0–7.5)
Protein, UA: NEGATIVE
Specific Gravity, UA: 1.029 (ref 1.005–1.030)
UUROB: 0.2 mg/dL (ref 0.2–1.0)

## 2015-08-30 MED ORDER — SULFAMETHOXAZOLE-TRIMETHOPRIM 800-160 MG PO TABS: 1.0000 | ORAL_TABLET | Freq: Two times a day (BID) | ORAL | Status: DC

## 2015-08-30 NOTE — Progress Notes (Signed)
I agree with the assessment and plan as directed by NP .The patient is known to me .   Vernetta Dizdarevic, MD  

## 2015-08-30 NOTE — Telephone Encounter (Signed)
Received fax confirmation of lab results.

## 2015-08-30 NOTE — Telephone Encounter (Signed)
I called the patient. No answer. Left VM.

## 2015-08-30 NOTE — Addendum Note (Signed)
Addended by: Enedina FinnerMILLIKAN, Deloma Spindle P on: 08/30/2015 09:23 AM   Modules accepted: Orders

## 2015-08-30 NOTE — Telephone Encounter (Signed)
Patient returned Megan's call. Please call [phone].

## 2015-08-30 NOTE — Telephone Encounter (Signed)
I called the patient. Her UA is + for nitrate, RBC and Ketones. I will treat for a UTI- Bactrim x 3 days. Also advised that trace amount of ketones could indicate diabetes. She was advised to make her PCP aware and schedule follow-up. Our office will fax over results to Dr. Timothy Lassousso.

## 2015-09-16 ENCOUNTER — Telehealth: Payer: Self-pay | Admitting: Adult Health

## 2015-09-16 ENCOUNTER — Other Ambulatory Visit: Payer: Self-pay | Admitting: *Deleted

## 2015-09-16 NOTE — Telephone Encounter (Signed)
Patient is calling to let you know why she cancelled her 7/6//17 appointment and how things have been going with her.  Please call.

## 2015-09-16 NOTE — Telephone Encounter (Signed)
LMVM for pt that returned call.  Please call back at her convenience.  

## 2015-09-17 NOTE — Telephone Encounter (Signed)
Pt called back.  She cancelled appt with MM/NP, since Ellis SavageLisa Poulos ([facility]) will follow and prescribe meds (including vyvannse and change to nuvigil).  She will f/u with Dr. Vickey Hugerohmeier once stable on medications.

## 2015-09-17 NOTE — Telephone Encounter (Signed)
LMVM for pt that returning call (second call), to call us back.

## 2015-09-26 ENCOUNTER — Ambulatory Visit: Payer: 59 | Admitting: Adult Health

## 2015-12-10 ENCOUNTER — Other Ambulatory Visit: Payer: Self-pay | Admitting: Dentistry

## 2015-12-10 DIAGNOSIS — M2652 Limited mandibular range of motion: Secondary | ICD-10-CM

## 2015-12-12 ENCOUNTER — Telehealth: Payer: Self-pay | Admitting: Neurology

## 2015-12-12 NOTE — Telephone Encounter (Signed)
Pt called said the HA's have changed in frequency and intensity for the past month but they have become debilitating the past 2wks. She is to start topamax and pain medication this weekenddi per psychiatrist. An appt was schedule with Dr D on 11/1 but can she see Vanessa Blanchard. Pls call

## 2015-12-12 NOTE — Telephone Encounter (Signed)
I spoke to pt. Since Aundra MilletMegan, NP has never evaluated her for headaches, pt needs to see Dr. Vickey Hugerohmeier. Pt asked for a sooner appt with Dr. Vickey Hugerohmeier if possible. I advised pt that if someone cancels, I will call her and let her know. Pt verbalized understanding.

## 2015-12-18 ENCOUNTER — Ambulatory Visit
Admission: RE | Admit: 2015-12-18 | Discharge: 2015-12-18 | Disposition: A | Discharge status: Home / Self Care | Payer: Self-pay | Source: Ambulatory Visit | Attending: Dentistry | Admitting: Dentistry

## 2015-12-18 DIAGNOSIS — M2652 Limited mandibular range of motion: Secondary | ICD-10-CM

## 2015-12-31 ENCOUNTER — Ambulatory Visit (INDEPENDENT_AMBULATORY_CARE_PROVIDER_SITE_OTHER): Payer: 59

## 2015-12-31 DIAGNOSIS — I493 Ventricular premature depolarization: Secondary | ICD-10-CM | POA: Diagnosis not present

## 2016-01-06 ENCOUNTER — Telehealth: Payer: Self-pay | Admitting: *Deleted

## 2016-01-06 NOTE — Telephone Encounter (Signed)
Follow Up:    Doctor did not give his phone number,he said he would her holter monitor results asap please.He said something about you could post them.

## 2016-01-06 NOTE — Telephone Encounter (Signed)
Holter monitor has not yet been scanned by [facility]. They have been contacted to please have results by AM. Message sent to Dr. Timothy Lassousso.

## 2016-01-16 NOTE — Telephone Encounter (Signed)
I called pt to offer her a sooner appt on 01/20/2016 at 3:30. No answer, left a message asking her to call me back.

## 2016-01-17 NOTE — Telephone Encounter (Signed)
Patient returned Kristen's call, relayed message below, offered sooner appt on 01/20/16, patient states since she has stopped taking a medication, headaches have gone away, she and her psychiatrist feel headaches were related to the medication. November 1st appointment cancelled, will call back to schedule appointment some time in January after insurance benefits changes.

## 2016-01-22 ENCOUNTER — Ambulatory Visit: Payer: 59 | Admitting: Neurology

## 2018-07-08 ENCOUNTER — Telehealth: Payer: Self-pay | Admitting: Cardiology

## 2018-07-08 NOTE — Telephone Encounter (Signed)
Please advise, I do not see her on the schedule for 04/20- she must have been cancelled.  Also have you received any EKG results for this patient?  Thanks!

## 2018-07-08 NOTE — Telephone Encounter (Signed)
New Message    Pt is calling and is wondering if she needs to change her appt to a virtual visit because she got a notification on My Chart  She is also wondering if she can get the results from her EKG that was done at [facility]   [phone] Vanessa Blanchard  He told the pt he would work on getting the results for her. She as been requesting them since March 8th and she was hoping to have them before her appt with Dr Cristal Deer    Please call

## 2018-07-11 ENCOUNTER — Telehealth: Payer: Self-pay | Admitting: Cardiology

## 2018-07-11 NOTE — Telephone Encounter (Signed)
Smartphone/ my chart/ virtual consent/ pre reg completed °

## 2018-07-11 NOTE — Telephone Encounter (Signed)
Virtual Visit Pre-Appointment Phone Call  Steps For Call:  1. Confirm consent - "In the setting of the current Covid19 crisis, you are scheduled for a (phone or video) visit with your provider on (date) at (time).  Just as we do with many in-office visits, in order for you to participate in this visit, we must obtain consent.  If you'd like, I can send this to your mychart (if signed up) or email for you to review.  Otherwise, I can obtain your verbal consent now.  All virtual visits are billed to your insurance company just like a normal visit would be.  By agreeing to a virtual visit, we'd like you to understand that the technology does not allow for your provider to perform an examination, and thus may limit your provider's ability to fully assess your condition. If your provider identifies any concerns that need to be evaluated in person, we will make arrangements to do so.  Finally, though the technology is pretty good, we cannot assure that it will always work on either your or our end, and in the setting of a video visit, we may have to convert it to a phone-only visit.  In either situation, we cannot ensure that we have a secure connection.  Are you willing to proceed?" STAFF: Did the patient verbally acknowledge consent to telehealth visit? Document YES/NO here: YES  2. Confirm the BEST phone number to call the day of the visit by including in appointment notes  3. Give patient instructions for WebEx/MyChart download to smartphone as below or Doximity/Doxy.me if video visit (depending on what platform provider is using)  4. Advise patient to be prepared with their blood pressure, heart rate, weight, any heart rhythm information, their current medicines, and a piece of paper and pen handy for any instructions they may receive the day of their visit  5. Inform patient they will receive a phone call 15 minutes prior to their appointment time (may be from unknown caller ID) so they should be  prepared to answer  6. Confirm that appointment type is correct in Epic appointment notes (VIDEO vs PHONE)     TELEPHONE CALL NOTE  Vanessa Blanchard has been deemed a candidate for a follow-up tele-health visit to limit community exposure during the Covid-19 pandemic. I spoke with the patient via phone to ensure availability of phone/video source, confirm preferred email & phone number, and discuss instructions and expectations.  I reminded DELVIN CHRZANOWSKI to be prepared with any vital sign and/or heart rhythm information that could potentially be obtained via home monitoring, at the time of her visit. I reminded SCOTT MALLICOAT to expect a phone call at the time of her visit if her visit.  Parke Poisson, RN 07/11/2018 11:07 AM   INSTRUCTIONS FOR DOWNLOADING THE WEBEX APP TO SMARTPHONE  - If Apple, ask patient to go to App Store and type in WebEx in the search bar. Download [employer], the blue/green circle. If Android, go to [employer] and type in [employer] in the search bar. The app is free but as with any other app downloads, their phone may require them to verify saved payment information or Apple/Android password.  - The patient does NOT have to create an account. - On the day of the visit, the assist will walk the patient through joining the meeting with the meeting number/password.  INSTRUCTIONS FOR DOWNLOADING THE MYCHART APP TO SMARTPHONE  - The patient must first  make sure to have activated MyChart and know their login information - If Apple, go to [employer] and type in MyChart in the search bar and download the app. If Android, ask patient to go to [employer] and type in [place] in the search bar and download the app. The app is free but as with any other app downloads, their phone may require them to verify saved payment information or Apple/Android password.  - The patient will need to then log into the app with their MyChart username and password, and select   as their healthcare provider to link the account. When it is time for your visit, go to the MyChart app, find appointments, and click Begin Video Visit. Be sure to Select Allow for your device to access the Microphone and Camera for your visit. You will then be connected, and your provider will be with you shortly.  **If they have any issues connecting, or need assistance please contact MyChart service desk [phone])**  **If using a computer, in order to ensure the best quality for their visit they will need to use either of the following Internet Browsers: [employer], or Google Chrome**  IF USING DOXIMITY or DOXY.ME - The patient will receive a link just prior to their visit, either by text or email (to be determined day of appointment depending on if it's doxy.me or Doximity).     FULL LENGTH CONSENT FOR TELE-HEALTH VISIT   I hereby voluntarily request, consent and authorize [facility] and its employed or contracted physicians, physician assistants, nurse practitioners or other licensed health care professionals (the Practitioner), to provide me with telemedicine health care services (the "Services") as deemed necessary by the treating Practitioner. I acknowledge and consent to receive the Services by the Practitioner via telemedicine. I understand that the telemedicine visit will involve communicating with the Practitioner through live audiovisual communication technology and the disclosure of certain medical information by electronic transmission. I acknowledge that I have been given the opportunity to request an in-person assessment or other available alternative prior to the telemedicine visit and am voluntarily participating in the telemedicine visit.  I understand that I have the right to withhold or withdraw my consent to the use of telemedicine in the course of my care at any time, without affecting my right to future care or treatment, and that the  Practitioner or I may terminate the telemedicine visit at any time. I understand that I have the right to inspect all information obtained and/or recorded in the course of the telemedicine visit and may receive copies of available information for a reasonable fee.  I understand that some of the potential risks of receiving the Services via telemedicine include:  Marland Kitchen Delay or interruption in medical evaluation due to technological equipment failure or disruption; . Information transmitted may not be sufficient (e.g. poor resolution of images) to allow for appropriate medical decision making by the Practitioner; and/or  . In rare instances, security protocols could fail, causing a breach of personal health information.  Furthermore, I acknowledge that it is my responsibility to provide information about my medical history, conditions and care that is complete and accurate to the best of my ability. I acknowledge that Practitioner's advice, recommendations, and/or decision may be based on factors not within their control, such as incomplete or inaccurate data provided by me or distortions of diagnostic images or specimens that may result from electronic transmissions. I understand that the practice of medicine is not an  exact science and that Practitioner makes no warranties or guarantees regarding treatment outcomes. I acknowledge that I will receive a copy of this consent concurrently upon execution via email to the email address I last provided but may also request a printed copy by calling the office of [facility].    I understand that my insurance will be billed for this visit.   I have read or had this consent read to me. . I understand the contents of this consent, which adequately explains the benefits and risks of the Services being provided via telemedicine.  . I have been provided ample opportunity to ask questions regarding this consent and the Services and have had my questions answered to my  satisfaction. . I give my informed consent for the services to be provided through the use of telemedicine in my medical care  By participating in this telemedicine visit I agree to the above.

## 2018-07-11 NOTE — Telephone Encounter (Signed)
LVM for pre reg °

## 2018-07-12 ENCOUNTER — Telehealth: Payer: Self-pay | Admitting: Cardiology

## 2018-07-12 ENCOUNTER — Telehealth (INDEPENDENT_AMBULATORY_CARE_PROVIDER_SITE_OTHER): Payer: 59 | Admitting: Cardiology

## 2018-07-12 ENCOUNTER — Encounter: Payer: Self-pay | Admitting: Cardiology

## 2018-07-12 VITALS — BP 125/59 | HR 65 | Ht 65.0 in | Wt 195.0 lb

## 2018-07-12 DIAGNOSIS — I493 Ventricular premature depolarization: Secondary | ICD-10-CM | POA: Diagnosis not present

## 2018-07-12 DIAGNOSIS — R002 Palpitations: Secondary | ICD-10-CM

## 2018-07-12 NOTE — Telephone Encounter (Signed)
Let's set her up with a 14 day Zio then, which is what we discussed at the visit. Thanks!

## 2018-07-12 NOTE — Patient Instructions (Signed)

## 2018-07-12 NOTE — Telephone Encounter (Signed)
Spoke with pt who states after her virtual appointment with Dr. Cristal Deer today, she has been experiencing palpations and feeling like her head is rushing. She report she has not taking any medications today and only had 1/2 cup of coffee. She report BP 154/79 HR 63. Will route to MD for recommendations.

## 2018-07-12 NOTE — Telephone Encounter (Signed)
° ° °  Patient c/o Palpitations:  High priority if patient c/o lightheadedness, shortness of breath, or chest pain  1) How long have you had palpitations/irregular HR/ Afib? Are you having the symptoms now? Episodes of palpitations today  2) Are you currently experiencing lightheadedness, SOB or CP? lightheaded  3) Do you have a history of afib (atrial fibrillation) or irregular heart rhythm? yes  4) Have you checked your BP or HR? (document readings if available): 154/79 HR 63 currently  5) Are you experiencing any other symptoms? palpitations

## 2018-07-12 NOTE — Progress Notes (Signed)
Virtual Visit via Video Note   This visit type was conducted due to national recommendations for restrictions regarding the COVID-19 Pandemic (e.g. social distancing) in an effort to limit this patient's exposure and mitigate transmission in our community.  Due to her co-morbid illnesses, this patient is at least at moderate risk for complications without adequate follow up.  This format is felt to be most appropriate for this patient at this time.  All issues noted in this document were discussed and addressed.  A limited physical exam was performed with this format.  Please refer to the patient's chart for her consent to telehealth for [facility].   Evaluation Performed:  New patient visit  Date:  07/12/2018   ID:  Vanessa Blanchard, DOB 06/21/1970, MRN [id number]  Patient Location: Home Provider Location: Home  PCP:  Creola Corn, MD  Cardiologist:  Jodelle Red, MD Electrophysiologist:  None   Chief Complaint:  palpitations  History of Present Illness:    Vanessa Blanchard is a 48 y.o. female with PMH hypothyroidism, cataplexy, and PVCs who is seen via video visit at the request of Dr. Timothy Lasso for consultation regarding palpitations.  The patient does not have symptoms concerning for COVID-19 infection (fever, chills, cough, or new shortness of breath).   Patient concerns: issues with switching SSRIs in the past, are her symptoms due to celexa? Stopped adderall with palpitations, feeling better, is it the adderall?  Tachycardia/palpitations: -Initial onset, frequency, duration: Initial onset several years ago, had switched medications from lexapro to effexor, palpitations lasted a few months. Monitor showed PVCs. Gradually got better on celexa, but still had less frequent palpitations, felt mostly at night. Recently was feeling palpitations daily for several weeks; first ER visit was with head symptoms (see below). Better with stopping caffeine and stopping adderall; currently  stopped the week of 4/1 and only had one episode of palpitations since, on 4/18. Had restarted adderall 2/10, stopped previously due to elevated BP and palpitations.  -Associated symptoms: palpitations, chest tightness/pounding, strange sensation on the left side of her head, felt full, felt dizzy/lightheaded -Aggravating/alleviating factors: better off caffeine/adderall -Syncope/near syncope: no -Prior cardiac history: none -Prior ECG: can't see in care everywere, noted as NSR with PVCs, no QTc noted -Prior workup: monitor (results reviewed) -Prior treatment: none, though now takes propranolol nightly -Possible medication interactions:adderrall, celexa -Caffeine:has cut back, symptoms better -Alcohol: none -Tobacco: none -OTC supplements: stopped thrive supplement because it had stimulant -Comorbidities: anxiety/depression, obesity, cataplexy -Exercise level: minimal currently. Had been working on diet/exercise in the past, with recent events has fallen away from this. -Labs: kidney function/electrolytes, CBC reviewed. tsh stable on synthroid -Family history: dad just had MI two weeks ago (age 69), 2 LAD blockages. Both parents htn, high lipids, hypothyroidism, obesity. No diabetes. Griselda Miner with mild stroke in her late 41s.    Past Medical History:  Diagnosis Date  . Acne   . Allergic rhinitis   . Anxiety   . Depression   . Depression   . Erythema    nodosum, left lower leg  . Female stress incontinence   . Granuloma annulare   . Hirsutism   . Hyperhidrosis   . Hypothyroidism   . 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 Nephrolithiasis    right  . Obesity   . 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 TMJ (dislocation of temporomandibular joint)   . Varicose veins    BOTH LEGS, worsening,painfulk, leg fatigue extreme at  times  . Vitamin D deficiency    Past Surgical History:  Procedure Laterality Date  . ABDOMINAL HYSTERECTOMY  2010   partial, s/p  prolapse  . CESAREAN SECTION  2002  . KIDNEY STONE SURGERY    . miscarriage  2000  . tendonitis of right achilles  2011  . TMJ ARTHROPLASTY  1990  . TONSILLECTOMY  2004  . VAGINAL PROLAPSE REPAIR  2010     Current Meds  Medication Sig  . cholecalciferol (VITAMIN D3) 25 MCG (1000 UT) tablet Take 1,000 Units by mouth daily.  . citalopram (CELEXA) 40 MG tablet Take 40 mg by mouth daily.  Marland Kitchen levothyroxine (SYNTHROID, LEVOTHROID) 112 MCG tablet Take 112 mcg by mouth daily before breakfast.   . loratadine (CLARITIN) 10 MG tablet Take 10 mg by mouth daily as needed for allergies. OTC  . propranolol (INDERAL) 10 MG tablet Take 10 mg by mouth daily.     Allergies:   Provigil [modafinil]   Social History   Tobacco Use  . Smoking status: Never Smoker  . Smokeless tobacco: Never Used  Substance Use Topics  . Alcohol use: No  . Drug use: No     Family Hx: The patient's family history includes Acute lymphoblastic leukemia (age of onset: 3) in her son; High Cholesterol in her father and mother; Hyperlipidemia in her father; Hypertension in her father and mother; Hypothyroidism in her mother, son, and another family member; Obesity in her father and mother; Osteoarthritis in her mother.  ROS:   Please see the history of present illness.    All other systems reviewed and are negative.   Prior CV studies:   The following studies were reviewed today: Monitor from 2017, notes and studies in Care Everywhere  Labs/Other Tests and Data Reviewed:    EKG:  No images available, reviewed report in care everywhere  Recent Labs: No results found for requested labs within last 8760 hours.   Recent Lipid Panel No results found for: CHOL, TRIG, HDL, CHOLHDL, LDLCALC, LDLDIRECT  Wt Readings from Last 3 Encounters:  07/12/18 195 lb (88.5 kg)  08/29/15 199 lb 12.8 oz (90.6 kg)  04/14/13 211 lb (95.7 kg)     Objective:    Vital Signs:  BP (!) 125/59   Pulse 65   Ht 5\' 5"  (1.651 m)   Wt 195  lb (88.5 kg)   BMI 32.45 kg/m    VITAL SIGNS:  reviewed GEN:  no acute distress EYES:  sclerae anicteric, EOMI - Extraocular Movements Intact RESPIRATORY:  normal respiratory effort, symmetric expansion CARDIOVASCULAR:  no jvd visualized SKIN:  no lesions noted on areas visible on video chat MUSCULOSKELETAL:  no obvious deformities. PSYCH:  normal affect  ASSESSMENT & PLAN:    1. Palpitations, history of PVCs: Symptoms now infrequent, only once in the last three weeks. Unlikely to capture events on a monitor given current frequency. Timeline appears to line up with re-initiation of adderal on 05/02/18, with increasing symptoms. Symptoms then gradually resolved with stopping adderall and caffeine. Prior monitor, as well as ER ECG, note PVCs. -would not restart adderall. I am not sure what non-stimulant options there are for her cataplexy, but I would be very careful with any stimulants given her history -we discussed celexa. There is some controversial evidence that it may have a higher rate of ventricular arrhythmias (data I saw suggests 0.06% vs 0.04% with other SSRIs), but the overall documented rate is low, and she was tolerating prior to most  recent episodes. I do not have an ECG documenting her QTc, but if this is being follow by another office and has been acceptable, I am ok with continuing the celexa for now. -she will contact the office if symptoms worsen again, at which time we will pursue a monitor -if monitor shows a high burden of PVCs, would then get echocardiogram and discuss options. -she is taking propranolol PRN, currently only at nighttime as she cannot tolerate during the day due to fatigue. I am unsure that she is getting any benefit from it, and she does feel tired during the day. She will trial not taking it at bedtime and see if her palpitations return and how she feels in the morning. Given her response to propranolol, if PVCs are seen, I do not expect that she would  tolerate metoprolol. Would need to try calcium channel blocker. -we will follow up in several months. At that time, we can also discuss her other cardiovascular risk factors and discuss primary prevention.  COVID-19 Education: The signs and symptoms of COVID-19 were discussed with the patient and how to seek care for testing (follow up with PCP or arrange E-visit).  The importance of social distancing was discussed today.  Time:   Today, I have spent 36 minutes with the patient with telehealth technology discussing the above problems.    Patient Instructions  Medication Instructions:  Your Physician recommend you continue on your current medication as directed.    If you need a refill on your cardiac medications before your next appointment, please call your pharmacy.   Lab work: None  Testing/Procedures: None  Follow-Up: At [facility], you and your health needs are our priority.  As part of our continuing mission to provide you with exceptional heart care, we have created designated Provider Care Teams.  These Care Teams include your primary Cardiologist (physician) and Advanced Practice Providers (APPs -  Physician Assistants and Nurse Practitioners) who all work together to provide you with the care you need, when you need it. You will need a follow up appointment in 4 months.  Please call our office 2 months in advance to schedule this appointment.  You may see Jodelle RedBridgette Kripa Foskey, MD or one of the following Advanced Practice Providers on your designated Care Team:   Theodore DemarkRhonda Barrett, PA-C . Joni ReiningKathryn Lawrence, DNP, ANP   '   Medication Adjustments/Labs and Tests Ordered: Current medicines are reviewed at length with the patient today.  Concerns regarding medicines are outlined above.   Tests Ordered: No orders of the defined types were placed in this encounter.   Medication Changes: No orders of the defined types were placed in this encounter.   Disposition:  Follow  up 4 months.  Signed, Jodelle RedBridgette Alysiana Ethridge, MD  07/12/2018 9:30 AM    [facility]

## 2018-07-13 NOTE — Telephone Encounter (Signed)
Pt updated with Dr. Di Kindle recommendations and voiced understanding. Order placed for monitor and message sent to schedulers.

## 2018-07-14 ENCOUNTER — Telehealth: Payer: Self-pay | Admitting: Radiology

## 2018-07-14 NOTE — Telephone Encounter (Signed)
Enrolled patient for a 14 day Zio long Term Holter Monitor to be mailed due to Covid-19. All instructions were gone over with patient and she knows to expect the monitor to arrive in 3-4 days

## 2018-07-24 ENCOUNTER — Other Ambulatory Visit (INDEPENDENT_AMBULATORY_CARE_PROVIDER_SITE_OTHER): Payer: 59

## 2018-07-24 DIAGNOSIS — R002 Palpitations: Secondary | ICD-10-CM

## 2018-07-25 ENCOUNTER — Telehealth: Payer: Self-pay | Admitting: Cardiology

## 2018-07-25 NOTE — Telephone Encounter (Signed)
  Lynden Ang is the therapist for Ms Albertelli and she is calling to speak with Dr Cristal Deer regarding the patient's anxiety issues on behalf of the patient

## 2018-07-25 NOTE — Telephone Encounter (Signed)
Forward to Dr Cristal Deer to contact  doctor

## 2018-07-26 ENCOUNTER — Ambulatory Visit: Payer: 59

## 2018-07-28 ENCOUNTER — Telehealth: Payer: Self-pay | Admitting: Cardiology

## 2018-07-28 NOTE — Telephone Encounter (Signed)
Pt wants to make sure provider saw the MyChart message sent yesterday. Her restlessness has increased and her Husband advised her to call rather than wait for a reply.

## 2018-07-28 NOTE — Telephone Encounter (Signed)
Below is mychart message  I have been wearing Zio monitor since Sunday May 3 and have had numerous episodes daily so hopefully it is capturing good data. I am using the Zio app to record information as well. Wanted to let Dr. Salena Saner know of a couple things: NP Baltazar Apo (my counselor) has left a message and would like to speak to Dr. Salena Saner. She is off next week, if possible return call today or tomorrow. Also, since ER visit on 3/8, I have gained 21 pounds and feel like I am retaining fluid. Legs are stiff and feel swollen, and more short of breath each day. Whatever is going on with heart seems to be worsening and causing retention. BP is stable though. I am fatigued and cont to have the left neck/head pressure with episodes which can last for over an hour. I am "okay" just wanted to add to clinical picture for when you get Zio results. Also, would like an in office/face to face appt to go over results and for exam. Please put me on schedule accordingly I have 11 more days to wear, then mail, then results to  you   Will forward to Dr Cristal Deer for review

## 2018-07-29 NOTE — Telephone Encounter (Signed)
Thank you for the messages. I am looking forward to the Zio results. I have unfortunately been very busy with clinical care and unable to reach out to your therapist this week. I am rounding in the hospital next week, and I understand she is off. With the current coronavirus crisis, we are limiting in office visits to one doctor/day and otherwise doing virtual visits. I am rounding in the hospital frequently in the next few weeks, and the next day I have available for in person visits is 6/26. I do have availability for virtual visits, including video, before that.  Please let us know what you would prefer to do. I think an appt in early-mid June should hopefully give [place] the right amount of time for the monitor results. If the results come back before your appointment, we will call you as soon as you review them.

## 2018-08-01 ENCOUNTER — Telehealth: Payer: Self-pay | Admitting: Cardiology

## 2018-08-01 NOTE — Telephone Encounter (Signed)
Returned call to patient she stated she is getting worse.She has gained 20 lbs since 06/08/18.She is sob and having swelling in both ankles and feet.Stated she continues to have chest tightness.No appointments available today.Advised she needs to go to [facility] ED to be evaluated.

## 2018-08-01 NOTE — Telephone Encounter (Signed)
Follow up:    Patient calling back concering SOB. Please call patient back. This her second time calling.

## 2018-08-01 NOTE — Telephone Encounter (Signed)
Follow up:    Patient calling because she do not want to go to hospital due to virus and would like to get appt if she can. Please call patient.

## 2018-08-01 NOTE — Telephone Encounter (Signed)
Message received from Triage. I reviewed the patient chart.   She may need to be monitored and undergo an echocardiogram. I do not believe we can accommodate an echocardiogram in the office today. Unfortunately, there are no in-person visits available today or tomorrow. I would advise her to go to the ER where she can receive a full workup to rule out heart failure, lower extremity DVT/PE, arrhythmias, underlying/acute pulmonary issues, thyroid problems, and anxiety.  Please let her know.

## 2018-08-01 NOTE — Telephone Encounter (Signed)
New Message    Pt is calling and really wants to be seen today   Pt c/o Shortness Of Breath: STAT if SOB developed within the last 24 hours or pt is noticeably SOB on the phone  1. Are you currently SOB (can you hear that pt is SOB on the phone)? Can hear a little over the phone   2. How long have you been experiencing SOB? Since last Thursday on and off   3. Are you SOB when sitting or when up moving around? Both   4. Are you currently experiencing any other symptoms? Pt is having swelling and she said she has been having SOB since Thursday. She called last week and has not heard anything back and she really doesn't want to go to the emergency room

## 2018-08-01 NOTE — Telephone Encounter (Signed)
Spoke to patient Vanessa Blanchard advice given.Patient still refuses to go to ED.Stated she wants appointment to be seen in office.Appointment scheduled with DOD Dr.Croitoru Thurs 5/14 at 8:00 am.Advised to go to ED if needed.

## 2018-08-01 NOTE — Telephone Encounter (Signed)
Returned call to patient no answer.Left message unable to find appointment for tomorrow.Advised I will message our PA who is helping in triage today to get her advice.

## 2018-08-01 NOTE — Telephone Encounter (Signed)
I reviewed the call and the note from [facility]. I am unfortunately out of the office and rounding in the hospital, and I will not have availability for some time. I agree with Bettina Gavia that if the symptoms are progressive and severe she may need to be evaluated urgently. It does not appear that she has gone to the ER that I can see. If she declines ER evaluation, we can set her up with an outpatient echo, but this cannot take the place of a full urgent workup. If she declines to go to the ER, can we have an APP set up a virtual video visit with her to get additional information? Thank you so much.

## 2018-08-02 NOTE — Telephone Encounter (Signed)
Pt has been scheduled to see Dr. Salena Saner in office on 5/14.

## 2018-08-04 ENCOUNTER — Other Ambulatory Visit: Payer: Self-pay

## 2018-08-04 ENCOUNTER — Ambulatory Visit (INDEPENDENT_AMBULATORY_CARE_PROVIDER_SITE_OTHER): Payer: 59 | Admitting: Cardiovascular Disease

## 2018-08-04 ENCOUNTER — Encounter: Payer: Self-pay | Admitting: Cardiovascular Disease

## 2018-08-04 VITALS — BP 112/76 | HR 76 | Ht 65.0 in | Wt 211.8 lb

## 2018-08-04 DIAGNOSIS — R760 Raised antibody titer: Secondary | ICD-10-CM

## 2018-08-04 DIAGNOSIS — Z8659 Personal history of other mental and behavioral disorders: Secondary | ICD-10-CM | POA: Diagnosis not present

## 2018-08-04 DIAGNOSIS — E668 Other obesity: Secondary | ICD-10-CM | POA: Diagnosis not present

## 2018-08-04 DIAGNOSIS — E038 Other specified hypothyroidism: Secondary | ICD-10-CM

## 2018-08-04 DIAGNOSIS — R0602 Shortness of breath: Secondary | ICD-10-CM | POA: Diagnosis not present

## 2018-08-04 DIAGNOSIS — I493 Ventricular premature depolarization: Secondary | ICD-10-CM | POA: Diagnosis not present

## 2018-08-04 DIAGNOSIS — E063 Autoimmune thyroiditis: Secondary | ICD-10-CM

## 2018-08-04 NOTE — Patient Instructions (Signed)
Medication Instructions:  Increase Propranolol 10 mg twice a day  If you need a refill on your cardiac medications before your next appointment, please call your pharmacy.   Lab work: None ordered   Testing/Procedures: Schedule Echo   Follow-Up: At [facility], you and your health needs are our priority.  As part of our continuing mission to provide you with exceptional heart care, we have created designated Provider Care Teams.  These Care Teams include your primary Cardiologist (physician) and Advanced Practice Providers (APPs -  Physician Assistants and Nurse Practitioners) who all work together to provide you with the care you need, when you need it. . Schedule follow up appointment with Dr.Christopher after Echo

## 2018-08-04 NOTE — Progress Notes (Signed)
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  More recently developed more troublesome "missed beats", associated with a sensation of fullness in her left neck and the left side of her head, all the way to the vertex, as well as some lightheadedness and "foggy" thoughts. Palpitations have persisted despite stopping Adderall and caffeine. Propranolol, prescribed once daily, has not helped much. She has never had syncope and there is no family history of unexplained sudden death.  She reports a 20 lb weight gain over the last 2 months, roughly coinciding with the coronavirus "lockdown". This was associated with ankle edema and exertional dyspnea. Edema improved with compression stockings and rest. She becomes dyspneic climbing just a few steps.  She is concerned that her somatic complaints may be taken less seriously due to her history of anxiety and depression and does not feel that these issues are related. After trying several meds, Celexa is working well.  Has struggled with weight issues since problems with anorexia as a teenager. Reports that at the beginning of the year she weighed 185 lb, now 211. 04/21 telemedicine visit self reported weight was 195 lb. On LT4 supplements for >10 years (Hashimoto's, +ve antibodies), no change in dose for 3 years, normal TSH a few weeks ago.  Reports antiphospholipid sd, but previous miscarriage was at 10 weeks, first trimester. 2 successful pregnancies. Has positive ANA and APL. No history of DVT/PE.  Dad had  anterior MI at age 46. Has several family members with pacemakers, reportedly related to Lyme disease.  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  The patient does not have other symptoms concerning for COVID-19 infection (fever, chills, cough), only new shortness of breath.   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   TMJ (dislocation of temporomandibular joint)    Varicose veins    BOTH LEGS, worsening,painfulk, leg fatigue extreme at times   Vitamin D deficiency     Past Surgical History:  Procedure Laterality Date   ABDOMINAL HYSTERECTOMY  2010   partial, s/p prolapse   CESAREAN SECTION  2002   KIDNEY STONE SURGERY     miscarriage  2000   tendonitis of right achilles  2011   TMJ ARTHROPLASTY  1990   TONSILLECTOMY  2004   VAGINAL PROLAPSE REPAIR  2010    Current Medications: Current Meds  Medication Sig   cholecalciferol (VITAMIN D3) 25 MCG (1000 UT) tablet Take 1,000 Units by mouth daily.  citalopram (CELEXA) 40 MG tablet Take 40 mg by mouth daily.   levothyroxine (SYNTHROID, LEVOTHROID) 112 MCG tablet Take 112 mcg by mouth daily before breakfast.    loratadine (CLARITIN) 10 MG tablet Take 10 mg by mouth daily as needed for allergies. OTC   [DISCONTINUED] propranolol (INDERAL) 10 MG tablet Take 10 mg by mouth daily.     Allergies:   Provigil [modafinil]   Social History   Socioeconomic History   Marital status: Married    Spouse name: Acupuncturist   Number of children: 2   Years of education: Assoc deg   Highest education  level: Not on file  Occupational History   Occupation: Stay at home mom    Employer: UNEMPLOYED    Comment: Charity fundraiser degree  Social Network engineer strain: Not on file   Food insecurity:    Worry: Not on file    Inability: Not on file   Transportation needs:    Medical: Not on file    Non-medical: Not on file  Tobacco Use   Smoking status: Never Smoker   Smokeless tobacco: Never Used  Substance and Sexual Activity   Alcohol use: No   Drug use: No   Sexual activity: Yes    Partners: Male  Lifestyle   Physical activity:    Days per week: Not on file    Minutes per session: Not on file   Stress: Not on file  Relationships   Social connections:    Talks on phone: Not on file    Gets together: Not on file    Attends religious service: Not on file    Active member of club or organization: Not on file    Attends meetings of clubs or organizations: Not on file    Relationship status: Not on file  Other Topics Concern   Not on file  Social History Narrative   The patient is a Engineer, civil (consulting), RN NICU, now at home.  Caucasian female, married, has two children, two years of college and is not employed.    She denies any illegal drugs, alcohol , tobacco use, consumes one cup of coffee in mornings some days.           Family History: The patient's family history includes Acute lymphoblastic leukemia (age of onset: 3) in her son; High Cholesterol in her father and mother; Hyperlipidemia in her father; Hypertension in her father and mother; Hypothyroidism in her mother, son, and another family member; Obesity in her father and mother; Osteoarthritis in her mother.  ROS:   Please see the history of present illness.     All other systems reviewed and are negative.  EKGs/Labs/Other Studies Reviewed:    The following studies were reviewed today: Labs Dr. Timothy Lasso May 29 2018  EKG:  EKG is ordered today.  The ekg ordered today demonstrates NSR, normal tracing, QTc 458  ms  Recent Labs: Hgb 13.2, plt 305K, WBC 4.7k K 3.9, creat 0.78, glucose 101, normal LFTs, TSH 1.37 Troponin T undetectable  Recent Lipid Panel No results found for: CHOL, TRIG, HDL, CHOLHDL, VLDL, LDLCALC, LDLDIRECT  Physical Exam:    VS:  BP 112/76    Pulse 76    Ht  (1.651 m)    Wt 211 lb 12.8 oz (96.1 kg)    BMI 35.25 kg/m     Wt Readings from Last 3 Encounters:  08/04/18 211 lb 12.8 oz (96.1 kg)  07/12/18 195 lb (88.5 kg)  08/29/15 199  lb 12.8 oz (90.6 kg)     GEN: moderately obese, well developed in no acute distress HEENT: Normal NECK: No JVD; No carotid bruits LYMPHATICS: No lymphadenopathy CARDIAC: RRR, no murmurs, rubs, gallops RESPIRATORY:  Clear to auscultation without rales, wheezing or rhonchi  ABDOMEN: Soft, non-tender, non-distended MUSCULOSKELETAL:  No edema; No deformity  SKIN: Warm and dry NEUROLOGIC:  Alert and oriented x 3 PSYCHIATRIC:  Normal affect   ASSESSMENT:    1. SOB (shortness of breath)   2. PVC's (premature ventricular contractions)   3. Moderate obesity   4. History of depression   5. Hypothyroidism due to Hashimoto's thyroiditis   6. Antiphospholipid antibody positive    PLAN:    In order of problems listed above:  1. Dyspnea:  Recent onset, on relatively mild exertion. Reports edema recently, but no signs of hypervolemia on today's exam. Will check echo. Neck pressure at rest radiating to left side of head is not consistent with angina (atypical migraine?). Premenopausal, very limited coronary risk factors (dad w CAD, but at advanced age). Suspicion for CAD is low. 2. Palpitations/PVCs: if there is no structural heart disease, these are less serious and require treatment only for symptom palliation. Once daily propranolol will not suffice, discussed its pharmacokinetics. Will try it twice daily. QTc upper limit of normal for gender. Agree with avoiding stimulants. 3. Moderate obesity: at risk for long term complications. Reports  negative sleep study for OSA, but diagnosed with narcolepsy (follow up Dr. Vickey Huger). 4. Depression/anxiety: appears compensated and she does not think her cardiac complaints are related to this.  5. Hypothyroidism: on appropriate LT4 dose. 6. +ve APL/ANA: but as far as I can tell, no true clinical events to support antiphospholipid syndrome (no DVT/PE, no mid-trimester miscarriage, normal platelets).   Medication Adjustments/Labs and Tests Ordered: Current medicines are reviewed at length with the patient today.  Concerns regarding medicines are outlined above.  Orders Placed This Encounter  Procedures   EKG 12-Lead   ECHOCARDIOGRAM COMPLETE   No orders of the defined types were placed in this encounter.   Patient Instructions  Medication Instructions:  Increase Propranolol 10 mg twice a day  If you need a refill on your cardiac medications before your next appointment, please call your pharmacy.   Lab work: None ordered   Testing/Procedures: Schedule Echo   Follow-Up: At [facility], you and your health needs are our priority.  As part of our continuing mission to provide you with exceptional heart care, we have created designated Provider Care Teams.  These Care Teams include your primary Cardiologist (physician) and Advanced Practice Providers (APPs -  Physician Assistants and Nurse Practitioners) who all work together to provide you with the care you need, when you need it.  Schedule follow up appointment with Dr.Christopher after Echo      Signed, Thurmon Fair, MD  08/07/2018 9:39 AM    [facility]

## 2018-08-05 ENCOUNTER — Ambulatory Visit (HOSPITAL_COMMUNITY): Payer: 59 | Attending: Cardiovascular Disease

## 2018-08-05 DIAGNOSIS — R0602 Shortness of breath: Secondary | ICD-10-CM | POA: Diagnosis present

## 2018-08-07 DIAGNOSIS — E038 Other specified hypothyroidism: Secondary | ICD-10-CM | POA: Insufficient documentation

## 2018-08-07 DIAGNOSIS — R760 Raised antibody titer: Secondary | ICD-10-CM | POA: Insufficient documentation

## 2018-08-07 DIAGNOSIS — E668 Other obesity: Secondary | ICD-10-CM | POA: Insufficient documentation

## 2018-08-07 DIAGNOSIS — Z8659 Personal history of other mental and behavioral disorders: Secondary | ICD-10-CM | POA: Insufficient documentation

## 2018-08-11 ENCOUNTER — Telehealth: Payer: Self-pay | Admitting: Cardiology

## 2018-08-11 NOTE — Telephone Encounter (Signed)
Left message for patient and also sent [facility] message.  She has an appointment on June 3 with Dr. Cristal Deer. If she is agreeable to a virtual visit, please change the appointment type.  ALSO, I changed her time to 11:00 INSTEAD OF 9:40. Please verify with patient that is ok. If she does not want a virtual visit, she can be rescheduled to late July/August. Thanks/dc

## 2018-08-22 NOTE — Telephone Encounter (Signed)
Left message for pt regarding changes to her upcoming ov with Dr Cristal Deer -- ttf 06/01

## 2018-08-24 ENCOUNTER — Ambulatory Visit (INDEPENDENT_AMBULATORY_CARE_PROVIDER_SITE_OTHER): Payer: 59 | Admitting: Cardiology

## 2018-08-24 ENCOUNTER — Other Ambulatory Visit: Payer: Self-pay

## 2018-08-24 ENCOUNTER — Encounter: Payer: Self-pay | Admitting: Cardiology

## 2018-08-24 VITALS — BP 126/78 | HR 74 | Temp 97.3°F | Resp 12 | Ht 65.0 in | Wt 214.4 lb

## 2018-08-24 DIAGNOSIS — R002 Palpitations: Secondary | ICD-10-CM | POA: Diagnosis not present

## 2018-08-24 DIAGNOSIS — Z712 Person consulting for explanation of examination or test findings: Secondary | ICD-10-CM | POA: Diagnosis not present

## 2018-08-24 DIAGNOSIS — R0602 Shortness of breath: Secondary | ICD-10-CM | POA: Diagnosis not present

## 2018-08-24 DIAGNOSIS — I493 Ventricular premature depolarization: Secondary | ICD-10-CM | POA: Diagnosis not present

## 2018-08-24 NOTE — Patient Instructions (Signed)

## 2018-08-24 NOTE — Progress Notes (Signed)
Cardiology Office Note:    Date:  08/24/2018   ID:  Vanessa Blanchard, DOB 1971-03-20, MRN [id number]  PCP:  Creola Cornusso, John, MD  Cardiologist:  Jodelle RedBridgette Maily Debarge, MD PhD  Referring MD: Creola Cornusso, John, MD   CC: follow up of test results.  History of Present Illness:    Vanessa Blanchard is a 48 y.o. female with a hx of symptomatic PVCs, cataplexy, hypothyroidism who is seen for follow up for the evaluation and management of PVCs. Please see initial virtual consult 07/12/18.  Patient concerns: -monitor results -echo is normal, so what is going on? -plan for propranolol -what is causing swelling? -what about the adderall?  We reviewed the results of the monitor (summarized below). Occasional PVCs. Echo is normal, which we reviewed. Discussed that this is a good prognostic sign.  She is taking propranolol 10 mg daily. Isn't sure it makes a difference. Does make her fatigued, so she takes at night.  Hasn't had any symptoms since May 11th. She isn't sure what the change is, no clear pattern to what caused it or what seemed to make it better. She is still concerned as the events seemed to come from nowhere. She would like guidance on what to tell her family if she has another event.  She has occasional mild LE edema. Has also gained weight, especially in her midsection. She isn't sure if this is normal weight gain or edema. No PND, orthopnea. She notes that when she was on adderall, her appetite was lower/weight easier to maintain.  We reviewed the adderall. In general, with PVCs or other arrhythmias, we recommend avoiding stimulants from a cardiac perspective. Ultimately, it is up to the patient and her neurologist or PCP to discuss risk and benefit. She has a complex history of cataplexy, and she felt more awake on adderall. She will discuss with her neurologist if there are any non-stimulant options for managing this.  Past Medical History:  Diagnosis Date  . Acne   . Allergic rhinitis   .  Anxiety   . Depression   . Depression   . Erythema    nodosum, left lower leg  . Female stress incontinence   . Granuloma annulare   . Hirsutism   . Hyperhidrosis   . Hypothyroidism   . 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 Nephrolithiasis    right  . Obesity   . 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 TMJ (dislocation of temporomandibular joint)   . Varicose veins    BOTH LEGS, worsening,painfulk, leg fatigue extreme at times  . Vitamin D deficiency     Past Surgical History:  Procedure Laterality Date  . ABDOMINAL HYSTERECTOMY  2010   partial, s/p prolapse  . CESAREAN SECTION  2002  . KIDNEY STONE SURGERY    . miscarriage  2000  . tendonitis of right achilles  2011  . TMJ ARTHROPLASTY  1990  . TONSILLECTOMY  2004  . VAGINAL PROLAPSE REPAIR  2010    Current Medications: Current Outpatient Medications on File Prior to Visit  Medication Sig  . cholecalciferol (VITAMIN D3) 25 MCG (1000 UT) tablet Take 1,000 Units by mouth daily.  . citalopram (CELEXA) 40 MG tablet Take 40 mg by mouth daily.  Marland Kitchen. levothyroxine (SYNTHROID, LEVOTHROID) 112 MCG tablet Take 112 mcg by mouth daily before breakfast.   . loratadine (CLARITIN) 10 MG tablet Take 10 mg by mouth daily as needed for allergies. OTC  . propranolol (INDERAL) 10 MG  tablet Take 10 mg twice a day   No current facility-administered medications on file prior to visit.      Allergies:   Provigil [modafinil]   Social History   Socioeconomic History  . Marital status: Married    Spouse name: Acupuncturist  . Number of children: 2  . Years of education: Assoc deg  . Highest education level: Not on file  Occupational History  . Occupation: Stay at home mom    Employer: UNEMPLOYED    Comment: RN degree  Social Needs  . Financial resource strain: Not on file  . Food insecurity:    Worry: Not on file    Inability: Not on file  . Transportation needs:    Medical: Not on file    Non-medical: Not on  file  Tobacco Use  . Smoking status: Never Smoker  . Smokeless tobacco: Never Used  Substance and Sexual Activity  . Alcohol use: No  . Drug use: No  . Sexual activity: Yes    Partners: Male  Lifestyle  . Physical activity:    Days per week: Not on file    Minutes per session: Not on file  . Stress: Not on file  Relationships  . Social connections:    Talks on phone: Not on file    Gets together: Not on file    Attends religious service: Not on file    Active member of club or organization: Not on file    Attends meetings of clubs or organizations: Not on file    Relationship status: Not on file  Other Topics Concern  . Not on file  Social History Narrative   The patient is a Engineer, civil (consulting), RN NICU, now at home.  Caucasian female, married, has two children, two years of college and is not employed.    She denies any illegal drugs, alcohol , tobacco use, consumes one cup of coffee in mornings some days.           Family History: The patient's family history includes Acute lymphoblastic leukemia (age of onset: 3) in her son; High Cholesterol in her father and mother; Hyperlipidemia in her father; Hypertension in her father and mother; Hypothyroidism in her mother, son, and another family member; Obesity in her father and mother; Osteoarthritis in her mother.  ROS:   Please see the history of present illness.  Additional pertinent ROS:  Constitutional: Negative for chills, fever, night sweats, unintentional weight loss  HENT: Negative for ear pain and hearing loss.   Eyes: Negative for loss of vision and eye pain.  Respiratory: Negative for cough, sputum, wheezing.   Cardiovascular: See HPI. Gastrointestinal: Negative for abdominal pain, melena, and hematochezia.  Genitourinary: Negative for dysuria and hematuria.  Musculoskeletal: Negative for falls and myalgias.  Skin: Negative for itching and rash.  Neurological: Negative for focal weakness, focal sensory changes and loss of  consciousness.  Endo/Heme/Allergies: Does not bruise/bleed easily.    EKGs/Labs/Other Studies Reviewed:    The following studies were reviewed today: Echo 08/05/18  1. The left ventricle has normal systolic function with an ejection fraction of 60-65%. The cavity size was normal. Left ventricular diastolic parameters were normal.  2. The right ventricle has normal systolic function. The cavity was normal. There is no increase in right ventricular wall thickness.  3. The aortic root and ascending aorta are normal in size and structure.  4. The inferior vena cava was normal in size with <50% respiratory variability.  Monitor 07/2018 11.5 days  of data recorded on Zio monitor. Patient had a min HR of 53 bpm, max HR of 138 bpm, and avg HR of 78 bpm. Predominant underlying rhythm was Sinus Rhythm. No VT, SVT, atrial fibrillation, high degree block, or pauses noted. Isolated atrial ectopy was rare (<1%). Isolated ventricular ectopy was occasional (4%). Heart rate dips appropriately with sleep. There were 122 patient triggered events. These are associated with underlying sinus rhythm with either isolated PVC, ventricular bigeminy or trigeminy.  EKG:  EKG is personally reviewed.  The ekg ordered 08/05/18 demonstrates NSR  Recent Labs: No results found for requested labs within last 8760 hours.  Recent Lipid Panel No results found for: CHOL, TRIG, HDL, CHOLHDL, VLDL, LDLCALC, LDLDIRECT  Physical Exam:    VS:  BP 126/78   Pulse 74   Temp (!) 97.3 F (36.3 C)   Resp 12   Ht 5\' 5"  (1.651 m)   Wt 214 lb 6.4 oz (97.3 kg)   SpO2 99%   BMI 35.68 kg/m     Wt Readings from Last 3 Encounters:  08/24/18 214 lb 6.4 oz (97.3 kg)  08/04/18 211 lb 12.8 oz (96.1 kg)  07/12/18 195 lb (88.5 kg)     GEN: Well nourished, well developed in no acute distress HEENT: Normal NECK: No JVD; No carotid bruits LYMPHATICS: No lymphadenopathy CARDIAC: regular rhythm, normal S1 and S2, no murmurs, rubs, gallops.  Radial and DP pulses 2+ bilaterally. RESPIRATORY:  Clear to auscultation without rales, wheezing or rhonchi  ABDOMEN: Soft, non-tender, non-distended MUSCULOSKELETAL:  No edema; No deformity  SKIN: Warm and dry NEUROLOGIC:  Alert and oriented x 3 PSYCHIATRIC:  Normal affect   ASSESSMENT:    1. PVC's (premature ventricular contractions)   2. Encounter to discuss test results   3. Heart palpitations   4. Shortness of breath    PLAN:    Palpitations/shortness of breath, history of PVCs: Symptoms have now calmed again since 08/01/18. Reviewed results of monitor with her as well as echo.  -Initial increase in symptoms aligned with re-initiation of adderal on 05/02/18, symptoms then gradually resolved with stopping adderall and caffeine.  -Prior monitor, as well as ER ECG, note PVCs. Repeat monitor done 07/2018 shows occasional PVCs -echo without structural abnormalities -would not restart adderall. I am not sure what non-stimulant options there are for her cataplexy, but I would be very careful with any stimulants given her history -she is taking propranolol PRN, currently only at nighttime as she cannot tolerate during the day due to fatigue. I am unsure that she is getting any benefit from it, and she does feel tired during the day. She will trial not taking it at bedtime and see if her palpitations return and how she feels in the morning. Given her response to propranolol, I do not expect that she would tolerate metoprolol. Would need to try calcium channel blocker if symptoms return. -counseled that edema, shortness of breath, weight gain do not clearly appear related to cardiac issues. Continue to monitor. Would increase activity as tolerated, aim for 150 minutes/week of moderate activity.  Plan for follow up: 6 mos or sooner if symptoms recur  TIME SPENT WITH PATIENT: 50 minutes of direct patient care. More than 50% of that time was spent on coordination of care and counseling regarding  test results, management plan.  Jodelle Red, MD, PhD [place]  [facility]   Medication Adjustments/Labs and Tests Ordered: Current medicines are reviewed at length with the patient today.  Concerns regarding medicines are outlined above.  No orders of the defined types were placed in this encounter.  No orders of the defined types were placed in this encounter.   Patient Instructions  Medication Instructions:  Your Physician recommend you continue on your current medication as directed.    If you need a refill on your cardiac medications before your next appointment, please call your pharmacy.   Lab work: None   Testing/Procedures: None  Follow-Up: At [facility], you and your health needs are our priority.  As part of our continuing mission to provide you with exceptional heart care, we have created designated Provider Care Teams.  These Care Teams include your primary Cardiologist (physician) and Advanced Practice Providers (APPs -  Physician Assistants and Nurse Practitioners) who all work together to provide you with the care you need, when you need it. You will need a follow up appointment in 6 months.  Please call our office 2 months in advance to schedule this appointment.  You may see Jodelle Red, MD or one of the following Advanced Practice Providers on your designated Care Team:   Theodore Demark, PA-C . Joni Reining, DNP, ANP      Signed, Jodelle Red, MD PhD 08/24/2018 10:26 PM    [facility]

## 2018-08-30 ENCOUNTER — Encounter: Payer: Self-pay | Admitting: Cardiology

## 2018-11-11 ENCOUNTER — Ambulatory Visit: Payer: 59 | Admitting: Cardiology

## 2018-12-05 ENCOUNTER — Other Ambulatory Visit: Payer: Self-pay

## 2018-12-05 ENCOUNTER — Telehealth: Payer: Self-pay | Admitting: Adult Health

## 2018-12-05 MED ORDER — CITALOPRAM HYDROBROMIDE 40 MG PO TABS: 40.0000 mg | ORAL_TABLET | Freq: Every day | ORAL | 0 refills | Status: DC

## 2018-12-05 NOTE — Telephone Encounter (Signed)
[facility] sent a refill for Vanessa Blanchard's citalopram.  Rollene Fare said it would be ok to send in.  She has an appt.12/07/18.  Please escribe to [facility].  Looks like it is 40mg  q day.  It will need to be a 90 day since its mail order.

## 2018-12-05 NOTE — Telephone Encounter (Signed)
90 day Rx for Citalopram 40 mg submitted to [facility]

## 2018-12-07 ENCOUNTER — Other Ambulatory Visit: Payer: Self-pay

## 2018-12-07 ENCOUNTER — Encounter: Payer: Self-pay | Admitting: Adult Health

## 2018-12-07 ENCOUNTER — Ambulatory Visit (INDEPENDENT_AMBULATORY_CARE_PROVIDER_SITE_OTHER): Payer: 59 | Admitting: Adult Health

## 2018-12-07 DIAGNOSIS — F331 Major depressive disorder, recurrent, moderate: Secondary | ICD-10-CM | POA: Diagnosis not present

## 2018-12-07 DIAGNOSIS — F411 Generalized anxiety disorder: Secondary | ICD-10-CM

## 2018-12-07 DIAGNOSIS — F909 Attention-deficit hyperactivity disorder, unspecified type: Secondary | ICD-10-CM

## 2018-12-07 MED ORDER — AMPHETAMINE-DEXTROAMPHETAMINE 20 MG PO TABS: 20.0000 mg | ORAL_TABLET | Freq: Two times a day (BID) | ORAL | 0 refills | Status: DC

## 2018-12-07 MED ORDER — CITALOPRAM HYDROBROMIDE 40 MG PO TABS: 40.0000 mg | ORAL_TABLET | Freq: Every day | ORAL | 1 refills | Status: DC

## 2018-12-07 NOTE — Progress Notes (Signed)
Vanessa Blanchard [id number] [birth year] 48 y.o.  Subjective:   Patient ID:  Vanessa Blanchard is a 48 y.o. (DOB 04-09-1970) female.  Chief Complaint:  Chief Complaint  Patient presents with  . ADHD  . Anxiety  . Depression  . Other    OCD    HPI Vanessa Blanchard presents to the office today for follow-up of anxiety, OCD, depression, and ADHD.  Describes mood today as "ok". Pleasant. Mood symptoms - denies depression, anxiety, and irritability. Stating "I'm doing pretty good". Stable interest and motivation. Taking medications as prescribed.  Energy levels stable. Active, does not have a regular exercise routine. Works 2 days a week - Therapist, sports.  Enjoys some usual interests and activities. Spending time with family - husband and childen. Appetite adequate. Weight gain - 205 - has lost 10 pounds. Recently started on [employer] - 23 points. Sleeps well most nights. Averages 6 to 8 hours. Focus and concentration stable. Completing tasks. Managing aspects of household.  Denies SI or HI. Denies AH or VH.  Review of Systems:  Review of Systems  Musculoskeletal: Negative for gait problem.  Neurological: Negative for tremors.  Psychiatric/Behavioral:       Please refer to HPI    Medications: I have reviewed the patient's current medications.  Current Outpatient Medications  Medication Sig Dispense Refill  . amphetamine-dextroamphetamine (ADDERALL) 20 MG tablet Take 1 tablet (20 mg total) by mouth 2 (two) times daily. 180 tablet 0  . cholecalciferol (VITAMIN D3) 25 MCG (1000 UT) tablet Take 1,000 Units by mouth daily.    . citalopram (CELEXA) 40 MG tablet Take 1 tablet (40 mg total) by mouth daily. 90 tablet 0  . citalopram (CELEXA) 40 MG tablet Take 1 tablet (40 mg total) by mouth daily. 90 tablet 1  . levothyroxine (SYNTHROID, LEVOTHROID) 112 MCG tablet Take 112 mcg by mouth daily before breakfast.     . loratadine (CLARITIN) 10 MG tablet Take 10 mg by mouth daily as needed for allergies. OTC    .  propranolol (INDERAL) 10 MG tablet Take 10 mg twice a day     No current facility-administered medications for this visit.     Medication Side Effects: None  Allergies:  Allergies  Allergen Reactions  . Provigil [Modafinil] Palpitations    Low dose of 150 mg was tolerated , 250 mg was not.     Past Medical History:  Diagnosis Date  . Acne   . Allergic rhinitis   . Anxiety   . Depression   . Depression   . Erythema    nodosum, left lower leg  . Female stress incontinence   . Granuloma annulare   . Hirsutism   . Hyperhidrosis   . Hypothyroidism   . 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 Nephrolithiasis    right  . Obesity   . 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 TMJ (dislocation of temporomandibular joint)   . Varicose veins    BOTH LEGS, worsening,painfulk, leg fatigue extreme at times  . Vitamin D deficiency     Family History  Problem Relation Age of Onset  . Hypothyroidism Mother   . Hypertension Mother   . Osteoarthritis Mother        bilateral knee replacements  . High Cholesterol Mother   . Obesity Mother   . Hypertension Father   . Hyperlipidemia Father   . High Cholesterol Father   . Obesity Father   . Acute lymphoblastic leukemia  Son 3       in remission  . Hypothyroidism Son   . Hypothyroidism Other     Social History   Socioeconomic History  . Marital status: Married    Spouse name: Acupuncturist  . Number of children: 2  . Years of education: Assoc deg  . Highest education level: Not on file  Occupational History  . Occupation: Stay at home mom    Employer: UNEMPLOYED    Comment: RN degree  Social Needs  . Financial resource strain: Not on file  . Food insecurity    Worry: Not on file    Inability: Not on file  . Transportation needs    Medical: Not on file    Non-medical: Not on file  Tobacco Use  . Smoking status: Never Smoker  . Smokeless tobacco: Never Used  Substance and Sexual Activity  . Alcohol  use: No  . Drug use: No  . Sexual activity: Yes    Partners: Male  Lifestyle  . Physical activity    Days per week: Not on file    Minutes per session: Not on file  . Stress: Not on file  Relationships  . Social Musician on phone: Not on file    Gets together: Not on file    Attends religious service: Not on file    Active member of club or organization: Not on file    Attends meetings of clubs or organizations: Not on file    Relationship status: Not on file  . Intimate partner violence    Fear of current or ex partner: Not on file    Emotionally abused: Not on file    Physically abused: Not on file    Forced sexual activity: Not on file  Other Topics Concern  . Not on file  Social History Narrative   The patient is a Engineer, civil (consulting), RN NICU, now at home.  Caucasian female, married, has two children, two years of college and is not employed.    She denies any illegal drugs, alcohol , tobacco use, consumes one cup of coffee in mornings some days.          Past Medical History, Surgical history, Social history, and Family history were reviewed and updated as appropriate.   Please see review of systems for further details on the patient's review from today.   Objective:   Physical Exam:  There were no vitals taken for this visit.  Physical Exam  Lab Review:     Component Value Date/Time   NA 138 06/28/2012 0927   K 4.8 06/28/2012 0927   CL 102 06/28/2012 0927   CO2 25 06/28/2012 0927   GLUCOSE 86 06/28/2012 0927   BUN 13 06/28/2012 0927   CREATININE 0.72 06/28/2012 0927   CALCIUM 9.6 06/28/2012 0927   PROT 7.1 06/28/2012 0927   ALBUMIN 4.4 06/28/2012 0927   AST 14 06/28/2012 0927   ALT 12 06/28/2012 0927   ALKPHOS 76 06/28/2012 0927   BILITOT 0.3 06/28/2012 0927   GFRNONAA 106 06/28/2012 0927   GFRAA 122 06/28/2012 0927       Component Value Date/Time   WBC 5.1 06/28/2012 0927   WBC 8.2 12/27/2008 0535   RBC 4.29 06/28/2012 0927   RBC 3.43 (L)  12/27/2008 0535   HGB 12.8 06/28/2012 0927   HCT 39.0 06/28/2012 0927   PLT 291 06/28/2012 0927   MCV 91 06/28/2012 0927   MCH 29.8 06/28/2012 2952  MCHC 32.8 06/28/2012 0927   MCHC 34.8 12/27/2008 0535   RDW 13.5 06/28/2012 0927    No results found for: POCLITH, LITHIUM   No results found for: PHENYTOIN, PHENOBARB, VALPROATE, CBMZ   .res Assessment: Plan:    Plan:  1. Celexa 40mg  daily 2. Adderall 20mg  BID  RTC 4 weeks  Patient advised to contact office with any questions, adverse effects, or acute worsening in signs and symptoms.  Florentina AddisonKatie was seen today for adhd, anxiety, depression and other.  Diagnoses and all orders for this visit:  Generalized anxiety disorder -     citalopram (CELEXA) 40 MG tablet; Take 1 tablet (40 mg total) by mouth daily.  Major depressive disorder, recurrent episode, moderate (HCC) -     citalopram (CELEXA) 40 MG tablet; Take 1 tablet (40 mg total) by mouth daily.  Attention deficit hyperactivity disorder (ADHD), unspecified ADHD type -     amphetamine-dextroamphetamine (ADDERALL) 20 MG tablet; Take 1 tablet (20 mg total) by mouth 2 (two) times daily.     Please see After Visit Summary for patient specific instructions.  No future appointments.  No orders of the defined types were placed in this encounter.   -------------------------------

## 2019-01-12 ENCOUNTER — Other Ambulatory Visit: Payer: Self-pay | Admitting: Adult Health

## 2019-04-11 ENCOUNTER — Telehealth: Payer: 59 | Admitting: Cardiology

## 2019-04-19 ENCOUNTER — Encounter: Payer: Self-pay | Admitting: Cardiology

## 2019-04-19 ENCOUNTER — Telehealth (INDEPENDENT_AMBULATORY_CARE_PROVIDER_SITE_OTHER): Payer: 59 | Admitting: Cardiology

## 2019-04-19 VITALS — BP 128/79 | HR 78 | Ht 65.0 in | Wt 199.0 lb

## 2019-04-19 DIAGNOSIS — I493 Ventricular premature depolarization: Secondary | ICD-10-CM

## 2019-04-19 DIAGNOSIS — R002 Palpitations: Secondary | ICD-10-CM

## 2019-04-19 NOTE — Progress Notes (Signed)
Virtual Visit via Video Note   This visit type was conducted due to national recommendations for restrictions regarding the COVID-19 Pandemic (e.g. social distancing) in an effort to limit this patient's exposure and mitigate transmission in our community.  Due to her co-morbid illnesses, this patient is at least at moderate risk for complications without adequate follow up.  This format is felt to be most appropriate for this patient at this time.  All issues noted in this document were discussed and addressed.  A limited physical exam was performed with this format.  Please refer to the patient's chart for her consent to telehealth for [facility].   Date:  04/19/2019   ID:  Vanessa Blanchard, DOB Feb 23, 1971, MRN [id number]  Patient Location: Home Provider Location: Home  PCP:  Creola Corn, MD  Cardiologist:  Jodelle Red, MD  Electrophysiologist:  None   Evaluation Performed:  Follow-Up Visit  Chief Complaint:  Follow up  History of Present Illness:    Vanessa Blanchard is a 49 y.o. female with a hx of symptomatic PVCs, cataplexy, hypothyroidism who is seen for follow up for the evaluation and management of PVCs. Please see initial virtual consult 07/12/18.  The patient does not have symptoms concerning for COVID-19 infection (fever, chills, cough, or new shortness of breath).   Today:  Since last visit, was doing very well with PVCs until last week. Had three days of feeling constant palpitations. Gets a pressure on the left side of the head and her left leg swells. Feels more breathless with these events. No clear triggers, went away on its own. Haven't taken propranolol, wasn't bad enough to take propranolol during this event.  Struggling with weight loss, did well in the spring and then worsened around the holidays. Has had hair loss, but not felt to be related to thyroid based on labs.   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   Otherwise  denies chest pain, PND, orthopnea, syncope.   Past Medical History:  Diagnosis Date  . Acne   . Allergic rhinitis   . Anxiety   . Depression   . Depression   . Erythema    nodosum, left lower leg  . Female stress incontinence   . Granuloma annulare   . Hirsutism   . Hyperhidrosis   . Hypothyroidism   . 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 Nephrolithiasis    right  . Obesity   . 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 TMJ (dislocation of temporomandibular joint)   . Varicose veins    BOTH LEGS, worsening,painfulk, leg fatigue extreme at times  . Vitamin D deficiency    Past Surgical History:  Procedure Laterality Date  . ABDOMINAL HYSTERECTOMY  2010   partial, s/p prolapse  . CESAREAN SECTION  2002  . KIDNEY STONE SURGERY    . miscarriage  2000  . tendonitis of right achilles  2011  . TMJ ARTHROPLASTY  1990  . TONSILLECTOMY  2004  . VAGINAL PROLAPSE REPAIR  2010     Current Meds  Medication Sig  . amphetamine-dextroamphetamine (ADDERALL) 20 MG tablet Take 1 tablet (20 mg total) by mouth 2 (two) times daily.  . cholecalciferol (VITAMIN D3) 25 MCG (1000 UT) tablet Take 1,000 Units by mouth daily.  . citalopram (CELEXA) 40 MG tablet Take 1 tablet (40 mg total) by mouth daily.  Marland Kitchen levothyroxine (SYNTHROID, LEVOTHROID) 112 MCG tablet Take 112  mcg by mouth daily before breakfast.   . loratadine (CLARITIN) 10 MG tablet Take 10 mg by mouth daily as needed for allergies. OTC  . propranolol (INDERAL) 10 MG tablet 2 (two) times daily as needed. Take 10 mg twice a day     Allergies:   Provigil [modafinil]   Social History   Tobacco Use  . Smoking status: Never Smoker  . Smokeless tobacco: Never Used  Substance Use Topics  . Alcohol use: No  . Drug use: No     Family Hx: The patient's family history includes Acute lymphoblastic leukemia (age of onset: 10) in her son; High Cholesterol in her father and mother; Hyperlipidemia in her father;  Hypertension in her father and mother; Hypothyroidism in her mother, son, and another family member; Obesity in her father and mother; Osteoarthritis in her mother.  ROS:   Please see the history of present illness.    All other systems reviewed and are negative.   Prior CV studies:   The following studies were reviewed today: Echo 08/05/18 1. The left ventricle has normal systolic function with an ejection fraction of 60-65%. The cavity size was normal. Left ventricular diastolic parameters were normal. 2. The right ventricle has normal systolic function. The cavity was normal. There is no increase in right ventricular wall thickness. 3. The aortic root and ascending aorta are normal in size and structure. 4. The inferior vena cava was normal in size with <50% respiratory variability.  Monitor 07/2018 11.5 days of data recorded on Zio monitor. Patient had a min HR of 53 bpm, max HR of 138 bpm, and avg HR of 78 bpm. Predominant underlying rhythm was Sinus Rhythm. No VT, SVT, atrial fibrillation, high degree block, or pauses noted. Isolated atrial ectopy was rare (<1%). Isolated ventricular ectopy was occasional (4%). Heart rate dips appropriately with sleep. There were 122 patient triggered events. These are associated with underlying sinus rhythm with either isolated PVC, ventricular bigeminy or trigeminy.  Labs/Other Tests and Data Reviewed:    EKG:  EKG is personally reviewed.  The ekg ordered 08/05/18 demonstrates NSR  Recent Labs: No results found for requested labs within last 8760 hours.   Recent Lipid Panel No results found for: CHOL, TRIG, HDL, CHOLHDL, LDLCALC, LDLDIRECT  Wt Readings from Last 3 Encounters:  04/19/19 199 lb (90.3 kg)  08/24/18 214 lb 6.4 oz (97.3 kg)  08/04/18 211 lb 12.8 oz (96.1 kg)     Objective:    Vital Signs:  BP 128/79   Pulse 78   Ht 5\' 5"  (1.651 m)   Wt 199 lb (90.3 kg)   BMI 33.12 kg/m    VITAL SIGNS:  reviewed GEN:  no acute  distress EYES:  sclerae anicteric, EOMI - Extraocular Movements Intact RESPIRATORY:  normal respiratory effort, symmetric expansion CARDIOVASCULAR:  no visible JVD SKIN:  no rash, lesions or ulcers. MUSCULOSKELETAL:  no obvious deformities. NEURO:  alert and oriented x 3, no obvious focal deficit PSYCH:  normal affect  ASSESSMENT & PLAN:    Palpitations/shortness of breath, history of PVCs: -Initial increase in symptoms aligned with re-initiation of adderal on 05/02/18, symptoms then gradually resolved with stopping adderall and caffeine. However, adderall helps her significantly with her cataplexy and was restarted 11/2018. Prior recommendations was to attempt to avoid stimulants if possible -Prior monitor, as well as ER ECG, note PVCs. Repeat monitor done 07/2018 shows occasional PVCs -echo without structural abnormalities -even with recent episode, has not required PRN propranolol, but  discussed how to use if needed.  COVID-19 Education: The signs and symptoms of COVID-19 were discussed with the patient and how to seek care for testing (follow up with PCP or arrange E-visit).  The importance of social distancing was discussed today.  Time:   Today, I have spent 14 minutes face to face with the patient with telehealth technology discussing the above problems.  Additional time including documentation 21 minutes  Patient Instructions  Medication Instructions:  Your Physician recommend you continue on your current medication as directed.    *If you need a refill on your cardiac medications before your next appointment, please call your pharmacy*  Lab Work: None  Testing/Procedures: None  Follow-Up: At [facility], you and your health needs are our priority.  As part of our continuing mission to provide you with exceptional heart care, we have created designated Provider Care Teams.  These Care Teams include your primary Cardiologist (physician) and Advanced Practice Providers (APPs  -  Physician Assistants and Nurse Practitioners) who all work together to provide you with the care you need, when you need it.  Your next appointment:   1 year(s)  The format for your next appointment:   In Person  Provider:   Dr. Cristal Deer      Medication Adjustments/Labs and Tests Ordered: Current medicines are reviewed at length with the patient today.  Concerns regarding medicines are outlined above.   Tests Ordered: No orders of the defined types were placed in this encounter.   Medication Changes: No orders of the defined types were placed in this encounter.   Follow Up:  1 year or sooner PRN  Signed, Jodelle Red, MD  04/19/2019 8:55 PM    [facility]

## 2019-04-19 NOTE — Patient Instructions (Signed)
Medication Instructions:  Your Physician recommend you continue on your current medication as directed.    *If you need a refill on your cardiac medications before your next appointment, please call your pharmacy*  Lab Work: None  Testing/Procedures: None  Follow-Up: At [facility], you and your health needs are our priority.  As part of our continuing mission to provide you with exceptional heart care, we have created designated Provider Care Teams.  These Care Teams include your primary Cardiologist (physician) and Advanced Practice Providers (APPs -  Physician Assistants and Nurse Practitioners) who all work together to provide you with the care you need, when you need it.  Your next appointment:   1 year(s)  The format for your next appointment:   In Person  Provider:   Dr. Cristal Deer

## 2019-06-08 NOTE — Telephone Encounter (Signed)
Can we get Ms. Granados a follow up in person visit? Thanks.

## 2019-06-08 NOTE — Telephone Encounter (Signed)
Called pt to schedule in office appointment with APP. Left message to call back.

## 2019-06-11 NOTE — Progress Notes (Signed)
. Cardiology Office Note   Date:  06/12/2019   ID:  SHARINA PETRE, DOB [birth year], MRN [id number]  PCP:  Shon Baton, MD  Cardiologist:  Dr.Christopher  CC: Follow up   History of Present Illness: Vanessa Blanchard is a 49 y.o. female who presents for ongoing assessment and management of frequent PVC's, with other history of cataplexy,and hypothyroidism.  Was last seen by Dr. Harrell Gave on 04/19/2019 and was doing very well. Had not needed to take propanolol very often. Denied any cardiac symptoms.   She comes today with recurrent symptoms of palpitations with neck and shoulder discomfort when they occur. She is also having increasing memory loss, and "word finding" when she is reading to her children. She is having vision changes as well.   She has not been seen by her neurologist in several months. She has been told she has vitamin D insufficiency and is on supplements, which she is not taking regularly. No gait disturbance, dizziness. Palpitations can cause her to feel uncomfortable sensation in her chest.    Past Medical History:  Diagnosis Date  . Acne   . Allergic rhinitis   . Anxiety   . Depression   . Depression   . Erythema    nodosum, left lower leg  . Female stress incontinence   . Granuloma annulare   . Hirsutism   . Hyperhidrosis   . Hypothyroidism   . 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 Nephrolithiasis    right  . Obesity   . 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 TMJ (dislocation of temporomandibular joint)   . Varicose veins    BOTH LEGS, worsening,painfulk, leg fatigue extreme at times  . Vitamin D deficiency     Past Surgical History:  Procedure Laterality Date  . ABDOMINAL HYSTERECTOMY  2010   partial, s/p prolapse  . CESAREAN SECTION  2002  . KIDNEY STONE SURGERY    . miscarriage  2000  . tendonitis of right achilles  2011  . TMJ ARTHROPLASTY  1990  . TONSILLECTOMY  2004  . VAGINAL PROLAPSE REPAIR  2010     Current  Outpatient Medications  Medication Sig Dispense Refill  . amphetamine-dextroamphetamine (ADDERALL) 20 MG tablet Take 1 tablet (20 mg total) by mouth 2 (two) times daily. 180 tablet 0  . cholecalciferol (VITAMIN D3) 25 MCG (1000 UT) tablet Take 1,000 Units by mouth daily.    . citalopram (CELEXA) 40 MG tablet Take 1 tablet (40 mg total) by mouth daily. 90 tablet 1  . levothyroxine (SYNTHROID, LEVOTHROID) 112 MCG tablet Take 112 mcg by mouth daily before breakfast.     . loratadine (CLARITIN) 10 MG tablet Take 10 mg by mouth daily as needed for allergies. OTC    . propranolol (INDERAL) 10 MG tablet 2 (two) times daily as needed. Take 10 mg twice a day     No current facility-administered medications for this visit.    Allergies:   Provigil [modafinil]    Social History:  The patient  reports that she has never smoked. She has never used smokeless tobacco. She reports that she does not drink alcohol or use drugs.   Family History:  The patient's family history includes Acute lymphoblastic leukemia (age of onset: 92) in her son; High Cholesterol in her father and mother; Hyperlipidemia in her father; Hypertension in her father and mother; Hypothyroidism in her mother, son, and another family member; Obesity in her father and mother; Osteoarthritis in  her mother.    ROS: All other systems are reviewed and negative. Unless otherwise mentioned in H&P    PHYSICAL EXAM: VS:  BP 132/74 (BP Location: Left Arm, Patient Position: Sitting, Cuff Size: Normal)   Pulse 72   Ht 5\' 5"  (1.651 m)   Wt 201 lb (91.2 kg)   BMI 33.45 kg/m  , BMI Body mass index is 33.45 kg/m. GEN: Well nourished, well developed, in no acute distress HEENT: normal Neck: no JVD, carotid bruits, or masses Cardiac:RRR; no murmurs, rubs, or gallops,no edema  Respiratory:  Clear to auscultation bilaterally, normal work of breathing GI: soft, nontender, nondistended, + BS MS: no deformity or atrophy Skin: warm and dry, no  rash Neuro:  Strength and sensation are intact Psych: euthymic mood, full affect   EKG: Not completed this office visit.  Recent Labs: No results found for requested labs within last 8760 hours.    Lipid Panel No results found for: CHOL, TRIG, HDL, CHOLHDL, VLDL, LDLCALC, LDLDIRECT    Wt Readings from Last 3 Encounters:  06/12/19 201 lb (91.2 kg)  04/19/19 199 lb (90.3 kg)  08/24/18 214 lb 6.4 oz (97.3 kg)      Other studies Reviewed: The following studies were reviewed today: Echo 08/05/18 1. The left ventricle has normal systolic function with an ejection fraction of 60-65%. The cavity size was normal. Left ventricular diastolic parameters were normal. 2. The right ventricle has normal systolic function. The cavity was normal. There is no increase in right ventricular wall thickness. 3. The aortic root and ascending aorta are normal in size and structure. 4. The inferior vena cava was normal in size with <50% respiratory variability.  Monitor 07/2018 11.5 days of data recorded on Zio monitor. Patient had a min HR of 53 bpm, max HR of 138 bpm, and avg HR of 78 bpm. Predominant underlying rhythm was Sinus Rhythm. No VT, SVT, atrial fibrillation, high degree block, or pauses noted. Isolated atrial ectopy was rare (<1%). Isolated ventricular ectopy was occasional (4%). Heart rate dips appropriately with sleep. There were 122 patient triggered events. These are associated with underlying sinus rhythm with either isolated PVC, ventricular bigeminy or trigeminy.   ASSESSMENT AND PLAN:  1. Palpitations: She is experiencing more palpitations lately with uncomfortable feeling in her chest when this occurs along with left neck and shoulder pain. She may take additional 10 mg daily for a total of 20 mg daily for palpitations.     2. Neck pain with palpitations: She will have carotid studies to assess for issues with vascular disease.  Likely spasms from stimulant-Adderal, but will  check for baseline.   3. Headache and memory loss with word finding: She is to follow up with her neurologist for these symptoms. Will get a MRI of the brain to evaluate for abnormalities. She is to follow up with neurology for treatment.   4. Hypothyroidism: She is to see PCP for management  5.  Vitamin D deficiency: She is to restart taking supplements as she was not doing so consistently.    Current medicines are reviewed at length with the patient today.  I have spent 20 dedicated to the care of this patient on the date of this encounter to include pre-visit review of records, assessment, management and diagnostic testing,with shared decision making.  Labs/ tests ordered today include: None  08/2018. Bettey Mare, ANP, [facility]   06/12/2019 8:43 AM    [facility] [address]  Office [phone] Fax [phone]  Notice: This dictation was prepared with Dragon dictation along with smaller phrase technology. Any transcriptional errors that result from this process are unintentional and may not be corrected upon review.

## 2019-06-12 ENCOUNTER — Encounter: Payer: Self-pay | Admitting: Adult Health

## 2019-06-12 ENCOUNTER — Other Ambulatory Visit: Payer: Self-pay

## 2019-06-12 ENCOUNTER — Ambulatory Visit: Payer: 59 | Admitting: Adult Health

## 2019-06-12 VITALS — BP 132/74 | HR 72 | Ht 65.0 in | Wt 201.0 lb

## 2019-06-12 DIAGNOSIS — R002 Palpitations: Secondary | ICD-10-CM | POA: Diagnosis not present

## 2019-06-12 DIAGNOSIS — Z79899 Other long term (current) drug therapy: Secondary | ICD-10-CM

## 2019-06-12 DIAGNOSIS — R413 Other amnesia: Secondary | ICD-10-CM

## 2019-06-12 DIAGNOSIS — G43009 Migraine without aura, not intractable, without status migrainosus: Secondary | ICD-10-CM | POA: Diagnosis not present

## 2019-06-12 NOTE — Addendum Note (Signed)
Addended by: Barrie Dunker on: 06/12/2019 09:02 AM   Modules accepted: Orders

## 2019-06-12 NOTE — Patient Instructions (Signed)
Medication Instructions:  Continue current medications  *If you need a refill on your cardiac medications before your next appointment, please call your pharmacy*   Lab Work: BMP  If you have labs (blood work) drawn today and your tests are completely normal, you will receive your results only by: Marland Kitchen MyChart Message (if you have MyChart) OR . A paper copy in the mail If you have any lab test that is abnormal or we need to change your treatment, we will call you to review the results.   Testing/Procedures: Your physician has requested that you have a carotid duplex. This test is an ultrasound of the carotid arteries in your neck. It looks at blood flow through these arteries that supply the brain with blood. Allow one hour for this exam. There are no restrictions or special instructions.  Your physician has requested that you have an MRI  Follow-Up: At [facility], you and your health needs are our priority.  As part of our continuing mission to provide you with exceptional heart care, we have created designated Provider Care Teams.  These Care Teams include your primary Cardiologist (physician) and Advanced Practice Providers (APPs -  Physician Assistants and Nurse Practitioners) who all work together to provide you with the care you need, when you need it.  We recommend signing up for the patient portal called "MyChart".  Sign up information is provided on this After Visit Summary.  MyChart is used to connect with patients for Virtual Visits (Telemedicine).  Patients are able to view lab/test results, encounter notes, upcoming appointments, etc.  Non-urgent messages can be sent to your provider as well.   To learn more about what you can do with MyChart, go to [email or web address].    Your next appointment:   3 month(s)  The format for your next appointment:   In Person  Provider:   You may see Jodelle Red, MD or one of the following Advanced Practice Providers on  your designated Care Team:    Theodore Demark, PA-C  Joni Reining, DNP, ANP  Cadence Fransico Michael, NP

## 2019-06-15 LAB — BASIC METABOLIC PANEL
BUN/Creatinine Ratio: 21 (ref 9–23)
BUN: 15 mg/dL (ref 6–24)
CO2: 23 mmol/L (ref 20–29)
Calcium: 9.5 mg/dL (ref 8.7–10.2)
Chloride: 102 mmol/L (ref 96–106)
Creatinine, Ser: 0.72 mg/dL (ref 0.57–1.00)
GFR calc Af Amer: 116 mL/min/{1.73_m2} (ref >59)
GFR calc non Af Amer: 101 mL/min/{1.73_m2} (ref >59)
Glucose: 102 mg/dL — ABNORMAL HIGH (ref 65–99)
Potassium: 4.6 mmol/L (ref 3.5–5.2)
Sodium: 139 mmol/L (ref 134–144)

## 2019-06-15 LAB — TSH: TSH: 3.7 u[IU]/mL (ref 0.450–4.500)

## 2019-06-26 ENCOUNTER — Other Ambulatory Visit: Payer: Self-pay

## 2019-06-26 ENCOUNTER — Other Ambulatory Visit: Payer: Self-pay | Admitting: Adult Health

## 2019-06-26 ENCOUNTER — Ambulatory Visit (HOSPITAL_COMMUNITY)
Admission: RE | Admit: 2019-06-26 | Discharge: 2019-06-26 | Disposition: A | Discharge status: Home / Self Care | Payer: 59 | Source: Ambulatory Visit | Attending: Internal Medicine | Admitting: Internal Medicine

## 2019-06-26 ENCOUNTER — Ambulatory Visit (HOSPITAL_COMMUNITY)
Admission: RE | Admit: 2019-06-26 | Discharge: 2019-06-26 | Disposition: A | Discharge status: Home / Self Care | Payer: 59 | Source: Ambulatory Visit | Attending: Adult Health | Admitting: Adult Health

## 2019-06-26 DIAGNOSIS — G43009 Migraine without aura, not intractable, without status migrainosus: Secondary | ICD-10-CM | POA: Diagnosis present

## 2019-06-26 DIAGNOSIS — R413 Other amnesia: Secondary | ICD-10-CM

## 2019-06-26 DIAGNOSIS — I493 Ventricular premature depolarization: Secondary | ICD-10-CM | POA: Diagnosis not present

## 2019-06-26 MED ORDER — GADOBUTROL 1 MMOL/ML IV SOLN
9.0000 mL | Freq: Once | INTRAVENOUS | Status: AC | PRN
  Administered 2019-06-26: 10:00:00 9 mL via INTRAVENOUS

## 2019-07-05 ENCOUNTER — Other Ambulatory Visit: Payer: Self-pay

## 2019-07-05 ENCOUNTER — Encounter: Payer: Self-pay | Admitting: Adult Health

## 2019-07-05 ENCOUNTER — Ambulatory Visit (INDEPENDENT_AMBULATORY_CARE_PROVIDER_SITE_OTHER): Payer: 59 | Admitting: Adult Health

## 2019-07-05 ENCOUNTER — Ambulatory Visit: Payer: 59 | Admitting: Adult Health

## 2019-07-05 DIAGNOSIS — F331 Major depressive disorder, recurrent, moderate: Secondary | ICD-10-CM

## 2019-07-05 DIAGNOSIS — F909 Attention-deficit hyperactivity disorder, unspecified type: Secondary | ICD-10-CM

## 2019-07-05 DIAGNOSIS — F411 Generalized anxiety disorder: Secondary | ICD-10-CM | POA: Diagnosis not present

## 2019-07-05 MED ORDER — CITALOPRAM HYDROBROMIDE 40 MG PO TABS: 40.0000 mg | ORAL_TABLET | Freq: Every day | ORAL | 3 refills | Status: DC

## 2019-07-05 MED ORDER — AMPHETAMINE-DEXTROAMPHETAMINE 20 MG PO TABS: 20.0000 mg | ORAL_TABLET | Freq: Two times a day (BID) | ORAL | 0 refills | Status: DC

## 2019-07-05 NOTE — Progress Notes (Signed)
Vanessa Blanchard [id number] Aug 01, 1970 49 y.o.  Subjective:   Patient ID:  Vanessa Blanchard is a 49 y.o. (DOB 1971-01-04) female.  Chief Complaint: No chief complaint on file.   HPI Vanessa Blanchard presents to the office today for follow-up of anxiety, OCD, depression, and ADHD.  Describes mood today as "ok". Pleasant. Mood symptoms - denies depression, anxiety, and irritability. Having "some bad days". Working with therapist. Stating "everything is going ok". Continues to have PVC's - random - not related to stress. Working with various providers to determine cause. No formal limitations. Recent MRI. Stable interest and motivation. Taking medications as prescribed.  Energy levels stable. Active, does not have a regular exercise routine.  Enjoys some usual interests and activities. Married. Lives with husband of 24 years. Spending time with family - husband and childen. Appetite adequate. Weight gain - 198 from 215. Working with [employer]. Sleeps well most nights. Averages 6 to 8 hours. Focus and concentration "could be better". Completing tasks. Managing aspects of household. 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  Denies SI or HI. Denies AH or VH.   Review of Systems:  Review of Systems  Musculoskeletal: Negative for gait problem.  Neurological: Negative for tremors.  Psychiatric/Behavioral:       Please refer to HPI    Medications: I have reviewed the patient's current medications.  Current Outpatient Medications  Medication Sig Dispense Refill  . amphetamine-dextroamphetamine (ADDERALL) 20 MG tablet Take 1 tablet (20 mg total) by mouth 2 (two) times daily. 180 tablet 0  . cholecalciferol (VITAMIN D3) 25 MCG (1000 UT) tablet Take 1,000 Units by mouth daily.    . citalopram (CELEXA) 40 MG tablet Take 1 tablet (40 mg total) by mouth daily. 90 tablet 3  . levothyroxine (SYNTHROID, LEVOTHROID) 112 MCG tablet Take 112 mcg by mouth daily before breakfast.     . loratadine (CLARITIN) 10 MG tablet Take 10 mg  by mouth daily as needed for allergies. OTC    . propranolol (INDERAL) 10 MG tablet 2 (two) times daily as needed. Take 10 mg twice a day     No current facility-administered medications for this visit.    Medication Side Effects: None  Allergies:  Allergies  Allergen Reactions  . Provigil [Modafinil] Palpitations    Low dose of 150 mg was tolerated , 250 mg was not.     Past Medical History:  Diagnosis Date  . Acne   . Allergic rhinitis   . Anxiety   . Depression   . Depression   . Erythema    nodosum, left lower leg  . Female stress incontinence   . Granuloma annulare   . Hirsutism   . Hyperhidrosis   . Hypothyroidism   . 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 Nephrolithiasis    right  . Obesity   . 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 TMJ (dislocation of temporomandibular joint)   . Varicose veins    BOTH LEGS, worsening,painfulk, leg fatigue extreme at times  . Vitamin D deficiency     Family History  Problem Relation Age of Onset  . Hypothyroidism Mother   . Hypertension Mother   . Osteoarthritis Mother        bilateral knee replacements  . High Cholesterol Mother   . Obesity Mother   . Hypertension Father   . Hyperlipidemia Father   . High Cholesterol Father   . Obesity Father   . Hypothyroidism Other   .  Acute lymphoblastic leukemia Son 3       in remission  . Hypothyroidism Son     Social History   Socioeconomic History  . Marital status: Married    Spouse name: Acupuncturist  . Number of children: 2  . Years of education: Assoc deg  . Highest education level: Not on file  Occupational History  . Occupation: Stay at home mom    Employer: UNEMPLOYED    Comment: RN degree  Tobacco Use  . Smoking status: Never Smoker  . Smokeless tobacco: Never Used  Substance and Sexual Activity  . Alcohol use: No  . Drug use: No  . Sexual activity: Yes    Partners: Male  Other Topics Concern  . Not on file  Social History  Narrative   The patient is a Engineer, civil (consulting), RN NICU, now at home.  Caucasian female, married, has two children, two years of college and is not employed.    She denies any illegal drugs, alcohol , tobacco use, consumes one cup of coffee in mornings some days.         Social Determinants of Health   Financial Resource Strain:   . Difficulty of Paying Living Expenses:   Food Insecurity:   . Worried About Programme researcher, broadcasting/film/video in the Last Year:   . Barista in the Last Year:   Transportation Needs:   . Freight forwarder (Medical):   Marland Kitchen Lack of Transportation (Non-Medical):   Physical Activity:   . Days of Exercise per Week:   . Minutes of Exercise per Session:   Stress:   . Feeling of Stress :   Social Connections:   . Frequency of Communication with Friends and Family:   . Frequency of Social Gatherings with Friends and Family:   . Attends Religious Services:   . Active Member of Clubs or Organizations:   . Attends Banker Meetings:   Marland Kitchen Marital Status:   Intimate Partner Violence:   . Fear of Current or Ex-Partner:   . Emotionally Abused:   Marland Kitchen Physically Abused:   . Sexually Abused:     Past Medical History, Surgical history, Social history, and Family history were reviewed and updated as appropriate.   Please see review of systems for further details on the patient's review from today.   Objective:   Physical Exam:  There were no vitals taken for this visit.  Physical Exam Constitutional:      General: She is not in acute distress. Musculoskeletal:        General: No deformity.  Neurological:     Mental Status: She is alert and oriented to person, place, and time.     Coordination: Coordination normal.  Psychiatric:        Attention and Perception: Attention and perception normal. She does not perceive auditory or visual hallucinations.        Mood and Affect: Mood normal. Mood is not anxious or depressed. Affect is not labile, blunt, angry or  inappropriate.        Speech: Speech normal.        Behavior: Behavior normal.        Thought Content: Thought content normal. Thought content is not paranoid or delusional. Thought content does not include homicidal or suicidal ideation. Thought content does not include homicidal or suicidal plan.        Cognition and Memory: Cognition and memory normal.        Judgment: Judgment normal.  Comments: Insight intact     Lab Review:     Component Value Date/Time   NA 139 06/15/2019 0832   K 4.6 06/15/2019 0832   CL 102 06/15/2019 0832   CO2 23 06/15/2019 0832   GLUCOSE 102 (H) 06/15/2019 0832   BUN 15 06/15/2019 0832   CREATININE 0.72 06/15/2019 0832   CALCIUM 9.5 06/15/2019 0832   PROT 7.1 06/28/2012 0927   ALBUMIN 4.4 06/28/2012 0927   AST 14 06/28/2012 0927   ALT 12 06/28/2012 0927   ALKPHOS 76 06/28/2012 0927   BILITOT 0.3 06/28/2012 0927   GFRNONAA 101 06/15/2019 0832   GFRAA 116 06/15/2019 0832       Component Value Date/Time   WBC 5.1 06/28/2012 0927   WBC 8.2 12/27/2008 0535   RBC 4.29 06/28/2012 0927   RBC 3.43 (L) 12/27/2008 0535   HGB 12.8 06/28/2012 0927   HCT 39.0 06/28/2012 0927   PLT 291 06/28/2012 0927   MCV 91 06/28/2012 0927   MCH 29.8 06/28/2012 0927   MCHC 32.8 06/28/2012 0927   MCHC 34.8 12/27/2008 0535   RDW 13.5 06/28/2012 0927    No results found for: POCLITH, LITHIUM   No results found for: PHENYTOIN, PHENOBARB, VALPROATE, CBMZ   .res Assessment: Plan:    Plan:  1. Celexa 40mg  daily 2. Adderall 20mg  BID  RTC 4 weeks  Patient advised to contact office with any questions, adverse effects, or acute worsening in signs and symptoms.  Diagnoses and all orders for this visit:  Generalized anxiety disorder -     citalopram (CELEXA) 40 MG tablet; Take 1 tablet (40 mg total) by mouth daily.  Major depressive disorder, recurrent episode, moderate (HCC) -     citalopram (CELEXA) 40 MG tablet; Take 1 tablet (40 mg total) by mouth  daily.  Attention deficit hyperactivity disorder (ADHD), unspecified ADHD type -     amphetamine-dextroamphetamine (ADDERALL) 20 MG tablet; Take 1 tablet (20 mg total) by mouth 2 (two) times daily.     Please see After Visit Summary for patient specific instructions.  Future Appointments  Date Time Provider [place]  08/23/2019  8:20 AM Buford Dresser, MD [facility]  10/02/2019 11:20 AM Ohana Birdwell, Berdie Ogren, NP [facility] None    No orders of the defined types were placed in this encounter.   -------------------------------

## 2019-07-17 ENCOUNTER — Other Ambulatory Visit: Payer: Self-pay | Admitting: Adult Health

## 2019-07-17 DIAGNOSIS — F909 Attention-deficit hyperactivity disorder, unspecified type: Secondary | ICD-10-CM

## 2019-07-17 DIAGNOSIS — F411 Generalized anxiety disorder: Secondary | ICD-10-CM

## 2019-07-17 DIAGNOSIS — F331 Major depressive disorder, recurrent, moderate: Secondary | ICD-10-CM

## 2019-07-17 MED ORDER — AMPHETAMINE-DEXTROAMPHETAMINE 20 MG PO TABS: 20.0000 mg | ORAL_TABLET | Freq: Two times a day (BID) | ORAL | 0 refills | Status: DC

## 2019-07-17 MED ORDER — CITALOPRAM HYDROBROMIDE 40 MG PO TABS: 40.0000 mg | ORAL_TABLET | Freq: Every day | ORAL | 3 refills | Status: DC

## 2019-07-26 ENCOUNTER — Other Ambulatory Visit: Payer: Self-pay | Admitting: Adult Health

## 2019-07-26 DIAGNOSIS — F411 Generalized anxiety disorder: Secondary | ICD-10-CM

## 2019-07-26 DIAGNOSIS — F331 Major depressive disorder, recurrent, moderate: Secondary | ICD-10-CM

## 2019-07-26 DIAGNOSIS — F909 Attention-deficit hyperactivity disorder, unspecified type: Secondary | ICD-10-CM

## 2019-07-26 MED ORDER — AMPHETAMINE-DEXTROAMPHETAMINE 20 MG PO TABS: 20.0000 mg | ORAL_TABLET | Freq: Two times a day (BID) | ORAL | 0 refills | Status: DC

## 2019-07-26 MED ORDER — CITALOPRAM HYDROBROMIDE 40 MG PO TABS: 40.0000 mg | ORAL_TABLET | Freq: Every day | ORAL | 3 refills | Status: DC

## 2019-08-01 ENCOUNTER — Other Ambulatory Visit: Payer: Self-pay | Admitting: Adult Health

## 2019-08-01 DIAGNOSIS — F909 Attention-deficit hyperactivity disorder, unspecified type: Secondary | ICD-10-CM

## 2019-08-01 DIAGNOSIS — F331 Major depressive disorder, recurrent, moderate: Secondary | ICD-10-CM

## 2019-08-01 DIAGNOSIS — F411 Generalized anxiety disorder: Secondary | ICD-10-CM

## 2019-08-01 MED ORDER — CITALOPRAM HYDROBROMIDE 40 MG PO TABS: 40.0000 mg | ORAL_TABLET | Freq: Every day | ORAL | 3 refills | Status: DC

## 2019-08-01 MED ORDER — AMPHETAMINE-DEXTROAMPHETAMINE 20 MG PO TABS: 20.0000 mg | ORAL_TABLET | Freq: Two times a day (BID) | ORAL | 0 refills | Status: DC

## 2019-08-03 ENCOUNTER — Telehealth: Payer: Self-pay

## 2019-08-03 NOTE — Telephone Encounter (Signed)
Prior authorization submitted on 08/02/2019 for AMPHETAMINE-DEXTROAMPHETAMINE 20 MG TABLETS #180 through [facility] [phone], p [id number], f   Pending response at this time

## 2019-08-10 NOTE — Telephone Encounter (Signed)
Contacted [facility] [phone] to follow up on her PA, the representative said they had faxed [place] back requesting office notes but I informed her we had not received that request but we would fax those today. fax [phone].

## 2019-08-23 ENCOUNTER — Ambulatory Visit: Payer: 59 | Admitting: Cardiology

## 2019-08-27 NOTE — Telephone Encounter (Signed)
Prior authorization approved 08/10/2019-08/09/2021 for AMPHETAMINE-DEXTROAMPHETAMINE 20 MG with [facility]

## 2019-10-02 ENCOUNTER — Ambulatory Visit: Payer: 59 | Admitting: Adult Health

## 2019-11-29 ENCOUNTER — Telehealth (INDEPENDENT_AMBULATORY_CARE_PROVIDER_SITE_OTHER): Payer: 59 | Admitting: Adult Health

## 2019-11-29 DIAGNOSIS — F909 Attention-deficit hyperactivity disorder, unspecified type: Secondary | ICD-10-CM

## 2019-11-29 DIAGNOSIS — F411 Generalized anxiety disorder: Secondary | ICD-10-CM

## 2019-11-29 DIAGNOSIS — F331 Major depressive disorder, recurrent, moderate: Secondary | ICD-10-CM

## 2019-11-29 MED ORDER — AMPHETAMINE-DEXTROAMPHETAMINE 20 MG PO TABS: 20.0000 mg | ORAL_TABLET | Freq: Two times a day (BID) | ORAL | 0 refills | Status: DC

## 2019-11-29 MED ORDER — CITALOPRAM HYDROBROMIDE 40 MG PO TABS: 40.0000 mg | ORAL_TABLET | Freq: Every day | ORAL | 3 refills | Status: DC

## 2019-11-29 NOTE — Progress Notes (Signed)
Vanessa JakschKatie J Wisecup [id number] 09/11/1970 49 y.o.  Virtual Visit via Telephone Note  I connected with pt on 11/29/19 at 11:40 AM EDT by telephone and verified that I am speaking with the correct person using two identifiers.   I discussed the limitations, risks, security and privacy concerns of performing an evaluation and management service by telephone and the availability of in person appointments. I also discussed with the patient that there may be a patient responsible charge related to this service. The patient expressed understanding and agreed to proceed.   I discussed the assessment and treatment plan with the patient. The patient was provided an opportunity to ask questions and all were answered. The patient agreed with the plan and demonstrated an understanding of the instructions.   The patient was advised to call back or seek an in-person evaluation if the symptoms worsen or if the condition fails to improve as anticipated.  I provided 3 minutes of non-face-to-face time during this encounter.  The patient was located at home.  [facility]   Dorothyann Gibbsegina N Shirl Ludington, NP   Subjective:   Patient ID:  Vanessa Blanchard is a 49 y.o. (DOB 09/11/1970) female.  Chief Complaint: No chief complaint on file.   HPI Vanessa Blanchard presents for follow-up of anxiety, OCD, depression, and ADHD.  Describes mood today as "ok". Pleasant. Mood symptoms - denies depression, anxiety, and irritability. Stating "I'm doing alright". Medications continue to work well - Adderall and Celexa. Started taking Vitamin D and PVC's have resolved. Working with therapist - Financial controllerCathy Showfety. Stable interest and motivation. Taking medications as prescribed.  Energy levels stable. Active, does not have a regular exercise routine.  Enjoys some usual interests and activities. Married. Lives with husband of 24 years. Spending time with family - husband and 2 children - son in college - 47 year  old daughter. Appetite adequate. Weight gain - 200 pounds.  Sleeps well most nights. Averages 6 to 8 hours. Focus and concentration stable with Adderall. Completing tasks. Managing aspects of household. 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  Denies SI or HI. Denies AH or VH.  Review of Systems:  Review of Systems  Musculoskeletal: Negative for gait problem.  Neurological: Negative for tremors.  Psychiatric/Behavioral:       Please refer to HPI    Medications: I have reviewed the patient's current medications.  Current Outpatient Medications  Medication Sig Dispense Refill  . amphetamine-dextroamphetamine (ADDERALL) 20 MG tablet Take 1 tablet (20 mg total) by mouth 2 (two) times daily. 180 tablet 0  . cholecalciferol (VITAMIN D3) 25 MCG (1000 UNIT) tablet Take 4,000 Units by mouth daily.    . citalopram (CELEXA) 40 MG tablet Take 1 tablet (40 mg total) by mouth daily. 90 tablet 3  . levothyroxine (SYNTHROID, LEVOTHROID) 112 MCG tablet Take 112 mcg by mouth daily before breakfast.     . loratadine (CLARITIN) 10 MG tablet Take 10 mg by mouth daily as needed for allergies. OTC    . propranolol (INDERAL) 10 MG tablet 2 (two) times daily as needed. Take 10 mg twice a day     No current facility-administered medications for this visit.    Medication Side Effects: None  Allergies:  Allergies  Allergen Reactions  . Provigil [Modafinil] Palpitations    Low dose of 150 mg was tolerated , 250 mg was not.     Past Medical History:  Diagnosis Date  . Acne   . Allergic  rhinitis   . Anxiety   . Depression   . Depression   . Erythema    nodosum, left lower leg  . Female stress incontinence   . Granuloma annulare   . Hirsutism   . Hyperhidrosis   . Hypothyroidism   . 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 Nephrolithiasis    right  . Obesity   . 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 TMJ (dislocation of temporomandibular joint)   . Varicose veins    BOTH LEGS,  worsening,painfulk, leg fatigue extreme at times  . Vitamin D deficiency     Family History  Problem Relation Age of Onset  . Hypothyroidism Mother   . Hypertension Mother   . Osteoarthritis Mother        bilateral knee replacements  . High Cholesterol Mother   . Obesity Mother   . Hypertension Father   . Hyperlipidemia Father   . High Cholesterol Father   . Obesity Father   . Hypothyroidism Other   . Acute lymphoblastic leukemia Son 3       in remission  . Hypothyroidism Son     Social History   Socioeconomic History  . Marital status: Married    Spouse name: Acupuncturist  . Number of children: 2  . Years of education: Assoc deg  . Highest education level: Not on file  Occupational History  . Occupation: Stay at home mom    Employer: UNEMPLOYED    Comment: RN degree  Tobacco Use  . Smoking status: Never Smoker  . Smokeless tobacco: Never Used  Substance and Sexual Activity  . Alcohol use: No  . Drug use: No  . Sexual activity: Yes    Partners: Male  Other Topics Concern  . Not on file  Social History Narrative   The patient is a Engineer, civil (consulting), RN NICU, now at home.  Caucasian female, married, has two children, two years of college and is not employed.    She denies any illegal drugs, alcohol , tobacco use, consumes one cup of coffee in mornings some days.         Social Determinants of Health   Financial Resource Strain:   . Difficulty of Paying Living Expenses: Not on file  Food Insecurity:   . Worried About Programme researcher, broadcasting/film/video in the Last Year: Not on file  . Ran Out of Food in the Last Year: Not on file  Transportation Needs:   . Lack of Transportation (Medical): Not on file  . Lack of Transportation (Non-Medical): Not on file  Physical Activity:   . Days of Exercise per Week: Not on file  . Minutes of Exercise per Session: Not on file  Stress:   . Feeling of Stress : Not on file  Social Connections:   . Frequency of Communication with Friends and Family: Not on  file  . Frequency of Social Gatherings with Friends and Family: Not on file  . Attends Religious Services: Not on file  . Active Member of Clubs or Organizations: Not on file  . Attends Banker Meetings: Not on file  . Marital Status: Not on file  Intimate Partner Violence:   . Fear of Current or Ex-Partner: Not on file  . Emotionally Abused: Not on file  . Physically Abused: Not on file  . Sexually Abused: Not on file    Past Medical History, Surgical history, Social history, and Family history were reviewed and updated as appropriate.  Please see review of systems for further details on the patient's review from today.   Objective:   Physical Exam:  There were no vitals taken for this visit.  Physical Exam Constitutional:      General: She is not in acute distress. Musculoskeletal:        General: No deformity.  Neurological:     Mental Status: She is alert and oriented to person, place, and time.     Coordination: Coordination normal.  Psychiatric:        Attention and Perception: Attention and perception normal. She does not perceive auditory or visual hallucinations.        Mood and Affect: Mood normal. Mood is not anxious or depressed. Affect is not labile, blunt, angry or inappropriate.        Speech: Speech normal.        Behavior: Behavior normal.        Thought Content: Thought content normal. Thought content is not paranoid or delusional. Thought content does not include homicidal or suicidal ideation. Thought content does not include homicidal or suicidal plan.        Cognition and Memory: Cognition and memory normal.        Judgment: Judgment normal.     Comments: Insight intact     Lab Review:     Component Value Date/Time   NA 139 06/15/2019 0832   K 4.6 06/15/2019 0832   CL 102 06/15/2019 0832   CO2 23 06/15/2019 0832   GLUCOSE 102 (H) 06/15/2019 0832   BUN 15 06/15/2019 0832   CREATININE 0.72 06/15/2019 0832   CALCIUM 9.5 06/15/2019  0832   PROT 7.1 06/28/2012 0927   ALBUMIN 4.4 06/28/2012 0927   AST 14 06/28/2012 0927   ALT 12 06/28/2012 0927   ALKPHOS 76 06/28/2012 0927   BILITOT 0.3 06/28/2012 0927   GFRNONAA 101 06/15/2019 0832   GFRAA 116 06/15/2019 0832       Component Value Date/Time   WBC 5.1 06/28/2012 0927   WBC 8.2 12/27/2008 0535   RBC 4.29 06/28/2012 0927   RBC 3.43 (L) 12/27/2008 0535   HGB 12.8 06/28/2012 0927   HCT 39.0 06/28/2012 0927   PLT 291 06/28/2012 0927   MCV 91 06/28/2012 0927   MCH 29.8 06/28/2012 0927   MCHC 32.8 06/28/2012 0927   MCHC 34.8 12/27/2008 0535   RDW 13.5 06/28/2012 0927    No results found for: POCLITH, LITHIUM   No results found for: PHENYTOIN, PHENOBARB, VALPROATE, CBMZ   .res Assessment: Plan:    Plan:  1. Celexa 40mg  daily 2. Adderall 20mg  BID  RTC 6 months to a year.  Patient advised to contact office with any questions, adverse effects, or acute worsening in signs and symptoms.  Discussed potential benefits, risks, and side effects of stimulants with patient to include increased heart rate, palpitations, insomnia, increased anxiety, increased irritability, or decreased appetite.  Instructed patient to contact office if experiencing any significant tolerability issues.   Diagnoses and all orders for this visit:  Generalized anxiety disorder -     citalopram (CELEXA) 40 MG tablet; Take 1 tablet (40 mg total) by mouth daily.  Major depressive disorder, recurrent episode, moderate (HCC) -     citalopram (CELEXA) 40 MG tablet; Take 1 tablet (40 mg total) by mouth daily.  Attention deficit hyperactivity disorder (ADHD), unspecified ADHD type -     amphetamine-dextroamphetamine (ADDERALL) 20 MG tablet; Take 1 tablet (20 mg total) by mouth 2 (  two) times daily.    Please see After Visit Summary for patient specific instructions.  No future appointments.  No orders of the defined types were placed in this encounter.      -------------------------------

## 2020-02-23 ENCOUNTER — Ambulatory Visit (INDEPENDENT_AMBULATORY_CARE_PROVIDER_SITE_OTHER): Payer: Self-pay | Admitting: Podiatry

## 2020-02-23 ENCOUNTER — Other Ambulatory Visit: Payer: Self-pay

## 2020-02-23 ENCOUNTER — Ambulatory Visit (INDEPENDENT_AMBULATORY_CARE_PROVIDER_SITE_OTHER): Payer: 59

## 2020-02-23 DIAGNOSIS — M722 Plantar fascial fibromatosis: Secondary | ICD-10-CM | POA: Diagnosis not present

## 2020-02-23 DIAGNOSIS — M79676 Pain in unspecified toe(s): Secondary | ICD-10-CM

## 2020-02-23 MED ORDER — TRIAMCINOLONE ACETONIDE 10 MG/ML IJ SUSP
10.0000 mg | Freq: Once | INTRAMUSCULAR | Status: AC
  Administered 2020-02-23: 10 mg

## 2020-02-23 NOTE — Patient Instructions (Signed)

## 2020-02-26 NOTE — Progress Notes (Signed)
Subjective:   Patient ID: Vanessa Blanchard, female   DOB: 49 y.o.   MRN: [id number]   HPI Patient presents stating she has had a lot of pain in both her heels and its been going on for approximately 6 months and worse over the last several months.  States that she is tried ice therapy over-the-counter insoles and shoe gear change without relief and does not smoke likes to be active   Review of Systems  All other systems reviewed and are negative.       Objective:  Physical Exam Vitals and nursing note reviewed.  Constitutional:      Appearance: She is well-developed.  Pulmonary:     Effort: Pulmonary effort is normal.  Musculoskeletal:        General: Normal range of motion.  Skin:    General: Skin is warm.  Neurological:     Mental Status: She is alert.     Neurovascular status intact muscle strength adequate range of motion within normal limits.  Patient is noted to have exquisite discomfort plantar aspect heel region bilateral inflammation fluid around the medial band and states the right has been more sore than the left foot that both are quite tender.  Patient has good digital perfusion well oriented x3     Assessment:  Cute plantar fasciitis bilateral with inflammation fluid around the medial band right over left     Plan:  H&P x-rays reviewed condition discussed education rendered.  Today I went ahead and I did do sterile prep and injected the plantar fascial bilateral 3 mg Kenalog 5 mg Xylocaine applied fascial brace right instructed on anti-inflammatories physical therapy shoe gear modifications and reappoint 2 weeks  X-rays indicate that there is small spur no indication stress fracture arthritis

## 2020-03-08 ENCOUNTER — Encounter: Payer: Self-pay | Admitting: Podiatry

## 2020-03-08 ENCOUNTER — Other Ambulatory Visit: Payer: Self-pay

## 2020-03-08 ENCOUNTER — Ambulatory Visit (INDEPENDENT_AMBULATORY_CARE_PROVIDER_SITE_OTHER): Payer: 59 | Admitting: Podiatry

## 2020-03-08 DIAGNOSIS — M722 Plantar fascial fibromatosis: Secondary | ICD-10-CM

## 2020-03-08 NOTE — Progress Notes (Signed)
Subjective:   Patient ID: Vanessa Blanchard, female   DOB: 49 y.o.   MRN: [id number]   HPI Patient states her heel is feeling a lot better with significant reduction of the discomfort she was experiencing   ROS      Objective:  Physical Exam  Neurovascular status intact with significant reduction of discomfort   heel right over left with a better heel toe.  Noted     Assessment:  Doing well after being treated for chronic heel pain     Plan:  Reviewed condition did H&P and discussed at great length etiology of this condition considerations conservative treatment.  I have recommended that we go ahead and utilize topical medicines oral medication physical therapy and home stretching exercises and shoe gear modifications.  I reviewed each of these individually and patient will be seen back as needed but so far I am hopeful as to her success

## 2020-04-28 IMAGING — MR MR HEAD WO/W CM
7 of 13 series · 23 of 48 positions shown · IV contrast (gadavist)
Comparison: None.

CLINICAL DATA: Memory loss, migraine.

EXAM:
MRI HEAD WITHOUT AND WITH CONTRAST
TECHNIQUE: Multiplanar, multiecho pulse sequences of the brain and surrounding
structures were obtained without and with intravenous contrast.
CONTRAST:  9mL GADAVIST GADOBUTROL 1 MMOL/ML IV SOLN

[Series 2: DWI · axial · 3.0mm · 0.94mm/px · z∈[-112,+32]mm · 7 of 100 slices shown (1 of 2)]
[im 1/100]
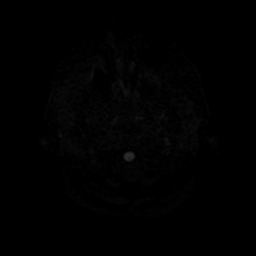
[im 17/100]
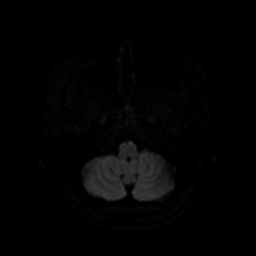
[im 34/100]
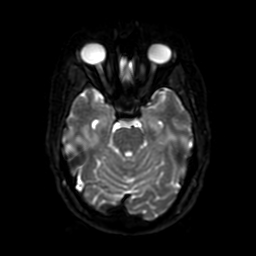
[im 50/100]
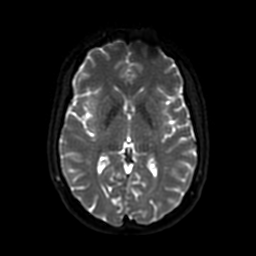
[im 67/100]
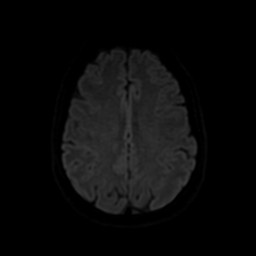
[im 83/100]
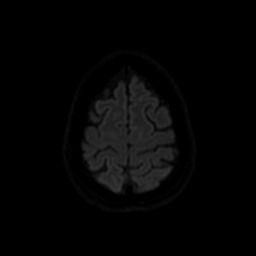
[im 100/100]
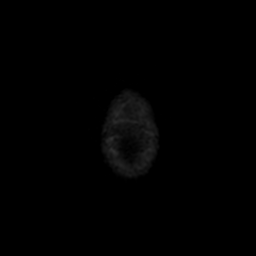

[Series 3: DWI · coronal · 4.0mm · 0.94mm/px · 5 of 74 slices shown (2 of 2)]
[im 1/74]
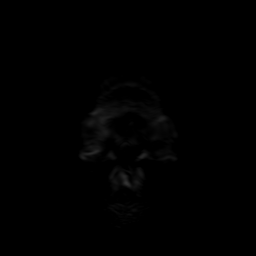
[im 19/74]
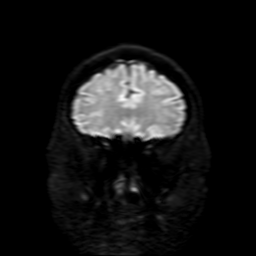
[im 37/74]
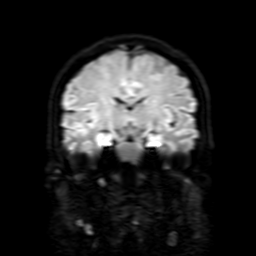
[im 55/74]
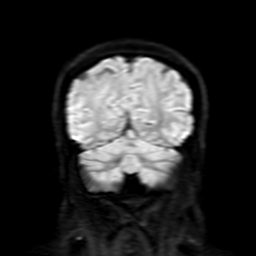
[im 74/74]
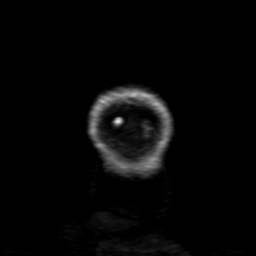

[Series 4: FLAIR · sagittal · 5.0mm · 0.23mm/px · 2 of 23 slices shown (1 of 2)]
[im 1/23]
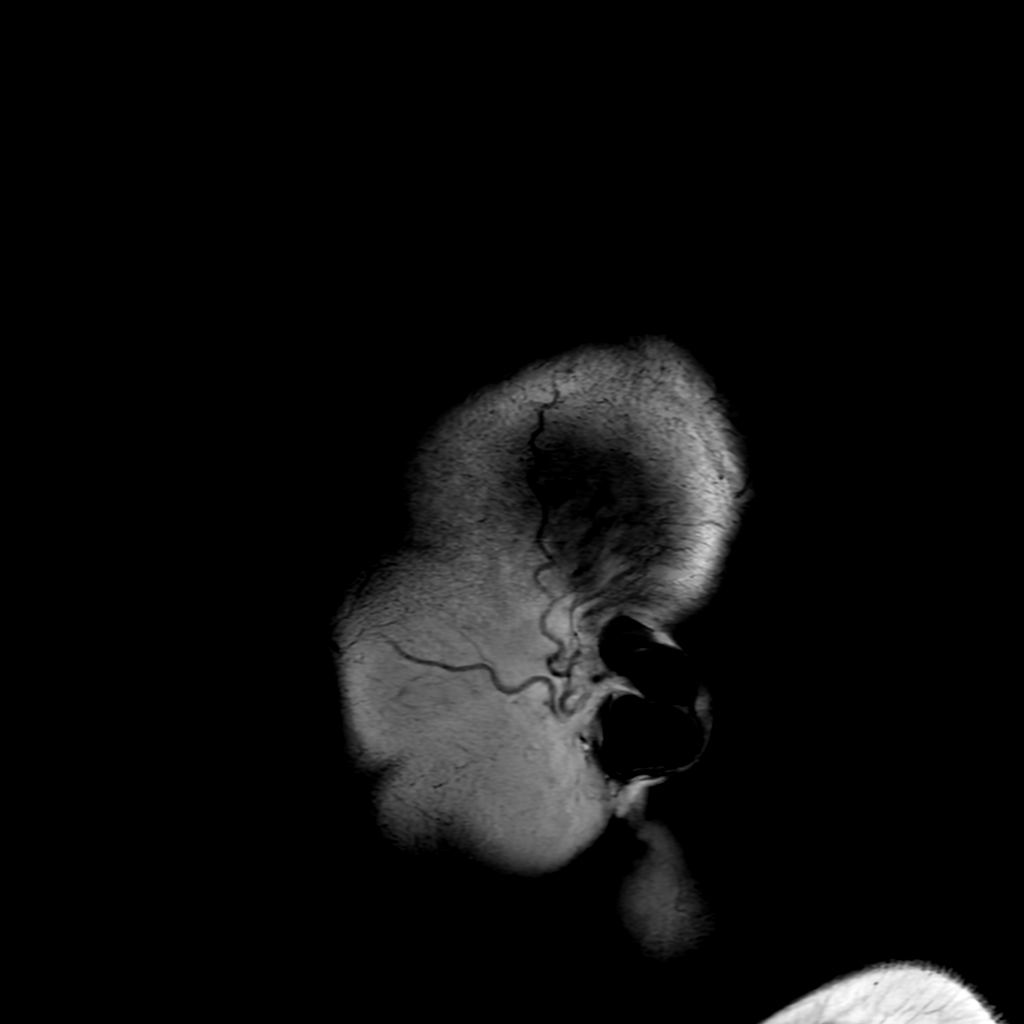
[im 23/23]
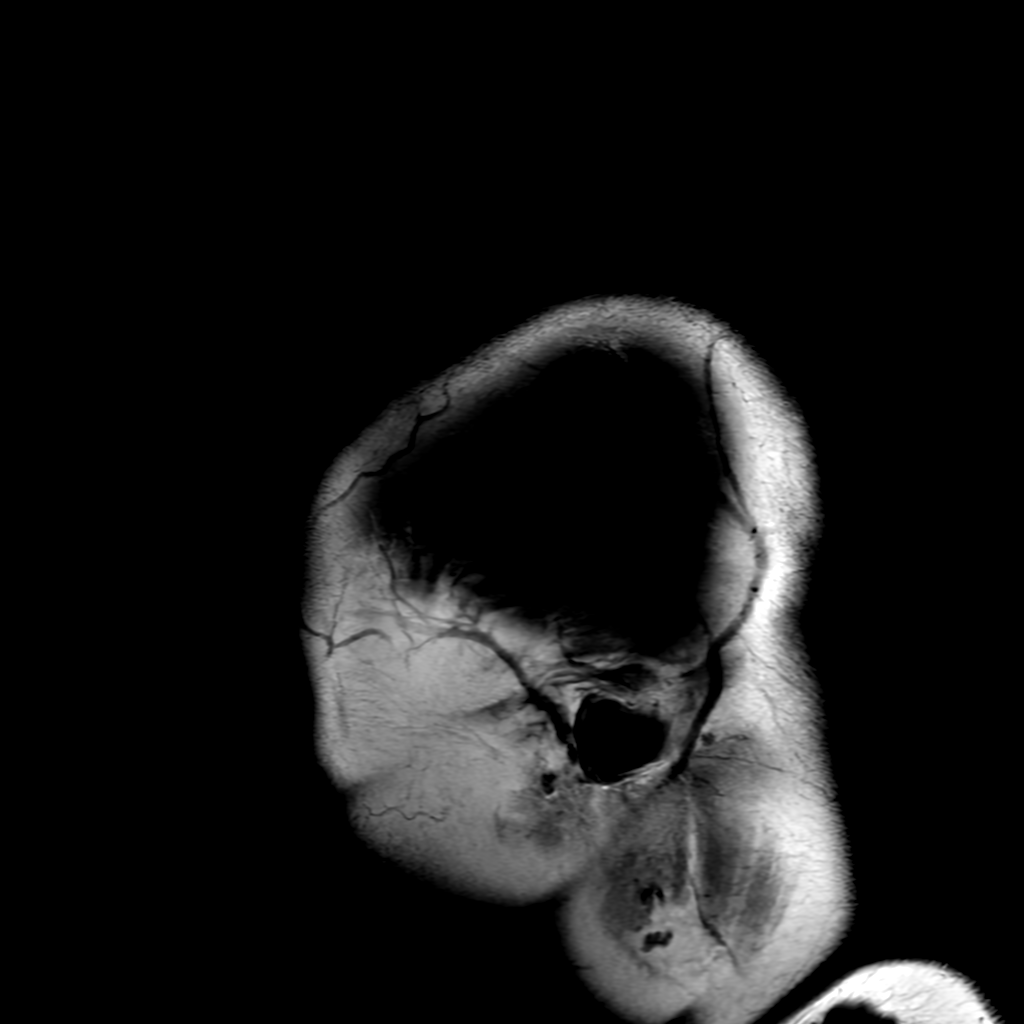

[Series 5: T2 · axial · 5.0mm · 0.23mm/px · 1 of 25 slices shown]
[im 1/25]
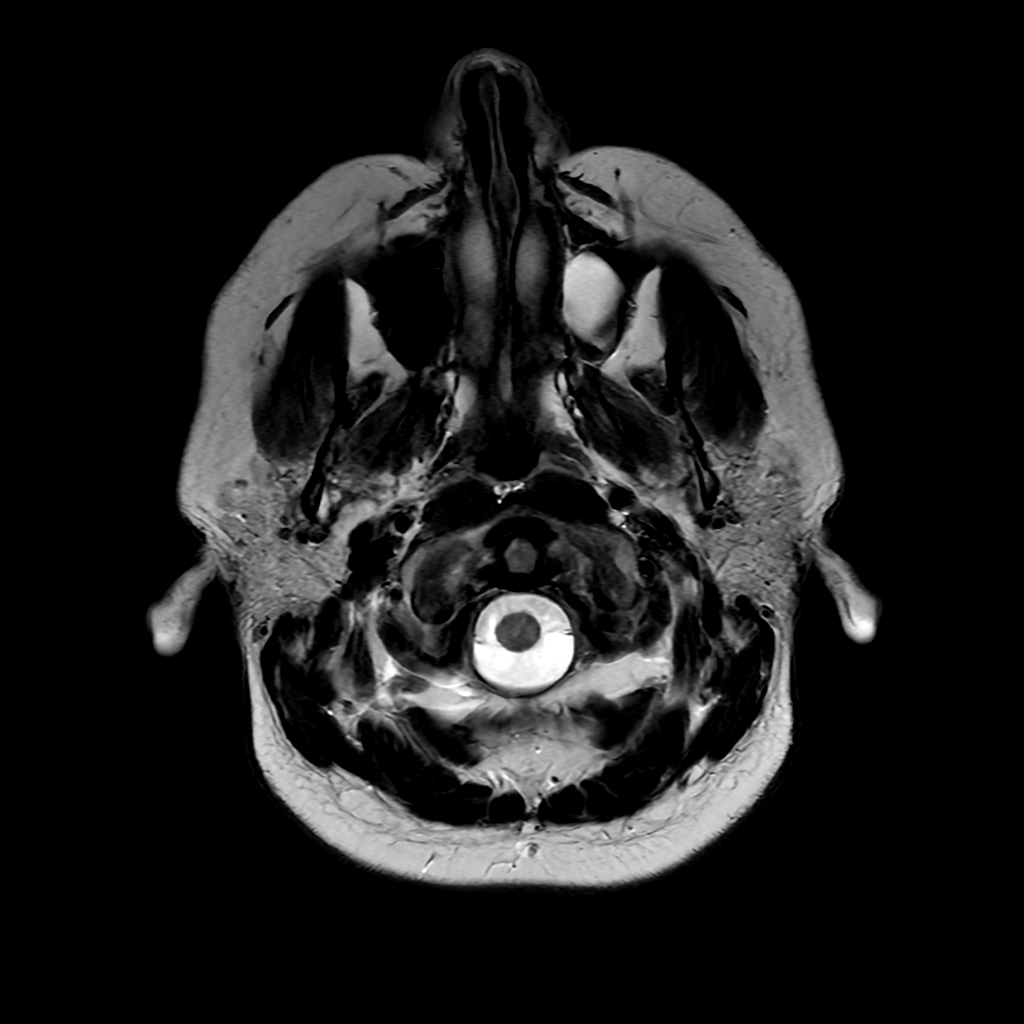

[Series 6: FLAIR · axial · 3.0mm · 0.41mm/px · z∈[-108,+34]mm · 2 of 25 slices shown (2 of 2)]
[im 1/25]
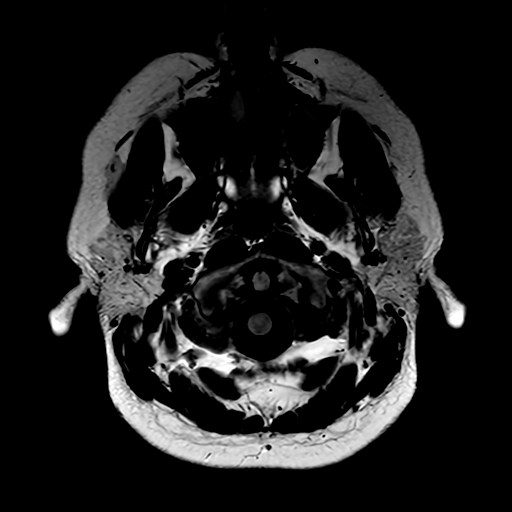
[im 25/25]
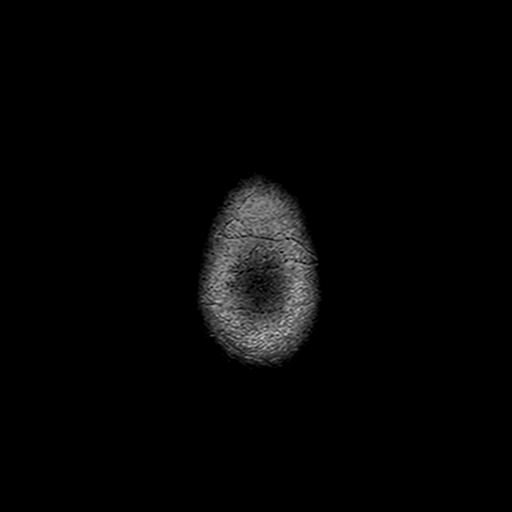

[Series 250: ADC · axial · 3.0mm · 0.94mm/px · z∈[-112,+32]mm · 3 of 50 slices shown (1 of 2)]
[im 1/50]
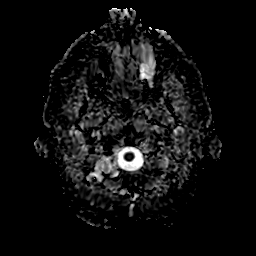
[im 25/50]
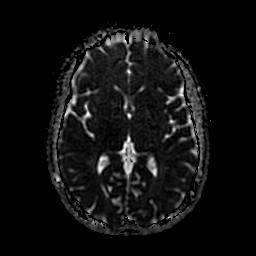
[im 50/50]
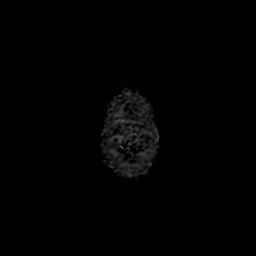

[Series 350: ADC · coronal · 4.0mm · 0.94mm/px · 3 of 37 slices shown (2 of 2)]
[im 1/37]
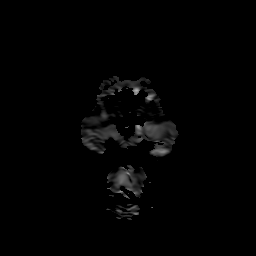
[im 19/37]
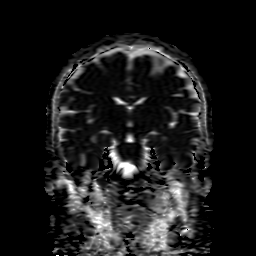
[im 37/37]
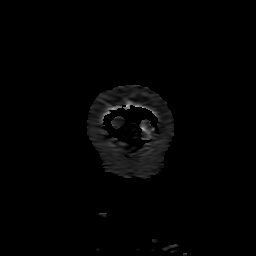

[23 of 48 positions shown; findings below may reference images not displayed]

FINDINGS: Brain: No acute infarction, hemorrhage, hydrocephalus, extra-axial
collection or mass lesion. The brain parenchyma has normal
morphology and signal characteristics

Vascular: Normal flow voids.

Skull and upper cervical spine: Normal marrow signal.

Sinuses/Orbits: Mucous retention cyst in the left maxillary sinus.
The orbits are maintained

Other: Small right mastoid effusion
IMPRESSION: 1. No acute intracranial abnormality. Unremarkable MRI of the brain.
2. Small right mastoid effusion.

## 2020-04-29 ENCOUNTER — Ambulatory Visit (INDEPENDENT_AMBULATORY_CARE_PROVIDER_SITE_OTHER): Payer: 59 | Admitting: Orthotics

## 2020-04-29 ENCOUNTER — Other Ambulatory Visit: Payer: Self-pay

## 2020-04-29 DIAGNOSIS — M722 Plantar fascial fibromatosis: Secondary | ICD-10-CM

## 2020-04-29 NOTE — Progress Notes (Signed)

## 2020-05-27 ENCOUNTER — Other Ambulatory Visit: Payer: 59

## 2020-06-19 ENCOUNTER — Other Ambulatory Visit: Payer: Self-pay

## 2020-06-19 ENCOUNTER — Ambulatory Visit: Payer: 59 | Admitting: Podiatry

## 2020-06-19 DIAGNOSIS — Q666 Other congenital valgus deformities of feet: Secondary | ICD-10-CM

## 2020-06-19 DIAGNOSIS — M722 Plantar fascial fibromatosis: Secondary | ICD-10-CM

## 2020-06-20 ENCOUNTER — Encounter: Payer: Self-pay | Admitting: Podiatry

## 2020-06-20 ENCOUNTER — Telehealth: Payer: Self-pay | Admitting: Podiatry

## 2020-06-20 NOTE — Progress Notes (Signed)
Subjective:  Patient ID: Vanessa Blanchard, female    DOB: 06-07-1970,  MRN: [id number]  Chief Complaint  Patient presents with  . Plantar Fasciitis    PT stated that the pain from her plantar fasciitis is back and she doesn't feel like her orthotics are right because they cause her so much discomfort     50 y.o. female presents with the above complaint.  Patient presents with recurrent of bilateral plantar fasciitis.  Patient states that his pain is getting worse.  Patient states the orthotics are not very comfortable.  She has brought the orthotics in with her.  She states that it continues to hurt her the right is worse than left side.  Is bad in the morning especially when taking the 4 steps she has to limp to get across the floor.  She denies any other treatment options she has had injections done in the past.  She already has brace and I encouraged her to start wearing the brace.  She denies any other acute complaints.   Review of Systems: Negative except as noted in the HPI. Denies N/V/F/Ch.  Past Medical History:  Diagnosis Date  . Acne   . Allergic rhinitis   . Anxiety   . Depression   . Depression   . Erythema    nodosum, left lower leg  . Female stress incontinence   . Granuloma annulare   . Hirsutism   . Hyperhidrosis   . Hypothyroidism   . 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 Nephrolithiasis    right  . Obesity   . 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 TMJ (dislocation of temporomandibular joint)   . Varicose veins    BOTH LEGS, worsening,painfulk, leg fatigue extreme at times  . Vitamin D deficiency     Current Outpatient Medications:  .  albuterol (VENTOLIN HFA) 108 (90 Base) MCG/ACT inhaler, Inhale 2 puffs into the lungs every 4 (four) hours as needed., Disp: , Rfl:  .  amphetamine-dextroamphetamine (ADDERALL) 20 MG tablet, Take 1 tablet (20 mg total) by mouth 2 (two) times daily., Disp: 180 tablet, Rfl: 0 .  cholecalciferol  (VITAMIN D3) 25 MCG (1000 UNIT) tablet, Take 4,000 Units by mouth daily., Disp: , Rfl:  .  citalopram (CELEXA) 40 MG tablet, Take 1 tablet (40 mg total) by mouth daily., Disp: 90 tablet, Rfl: 3 .  levothyroxine (SYNTHROID, LEVOTHROID) 112 MCG tablet, Take 112 mcg by mouth daily before breakfast. , Disp: , Rfl:  .  loratadine (CLARITIN) 10 MG tablet, Take 10 mg by mouth daily as needed for allergies. OTC, Disp: , Rfl:  .  propranolol (INDERAL) 10 MG tablet, 2 (two) times daily as needed. Take 10 mg twice a day, Disp: , Rfl:   Social History   Tobacco Use  Smoking Status Never Smoker  Smokeless Tobacco Never Used    Allergies  Allergen Reactions  . Sulfa Antibiotics Rash  . Provigil [Modafinil] Palpitations    Low dose of 150 mg was tolerated , 250 mg was not.    Objective:  There were no vitals filed for this visit. There is no height or weight on file to calculate BMI. Constitutional Well developed. Well nourished.  Vascular Dorsalis pedis pulses palpable bilaterally. Posterior tibial pulses palpable bilaterally. Capillary refill normal to all digits.  No cyanosis or clubbing noted. Pedal hair growth normal.  Neurologic Normal speech. Oriented to person, place, and time. Epicritic sensation to light touch grossly present bilaterally.  Dermatologic Nails well groomed and normal in appearance. No open wounds. No skin lesions.  Orthopedic: Normal joint ROM without pain or crepitus bilaterally. No visible deformities. Tender to palpation at the calcaneal tuber bilaterally. No pain with calcaneal squeeze bilaterally. Ankle ROM diminished range of motion bilaterally. Silfverskiold Test: positive bilaterally.   Radiographs: Taken and reviewed. No acute fractures or dislocations. No evidence of stress fracture.  Plantar heel spur present. Posterior heel spur present.   Assessment:   1. Pes planovalgus   2. Plantar fasciitis of right foot   3. Plantar fasciitis of left foot     Plan:  Patient was evaluated and treated and all questions answered.  Plantar Fasciitis, bilaterally - XR reviewed as above.  - Educated on icing and stretching. Instructions given.  - Injection delivered to the plantar fascia as below. - DME: Continue wearing plantar Fascial Brace - Pharmacologic management: None  Pes planovalgus -I explained the patient the etiology of pes planovalgus and various treatment options were discussed.  Patient had orthotics made by [facility].  However they do not seem to be functioning correctly.  I would like for her to undergo new pair of orthotics by obtaining a new impression.  We will plan on getting new orthotics for her.  Procedure: Injection Tendon/Ligament Location: Bilateral plantar fascia at the glabrous junction; medial approach. Skin Prep: alcohol Injectate: 0.5 cc 0.5% marcaine plain, 0.5 cc of 1% Lidocaine, 0.5 cc kenalog 10. Disposition: Patient tolerated procedure well. Injection site dressed with a band-aid.  No follow-ups on file.

## 2020-06-20 NOTE — Telephone Encounter (Signed)
Pt returned my call and stated as much as she appreciates me trying to get the appts on the same day she is not able to come in on 4.29 as her husband is having surgery. Also stated that Dr Allena Katz wanted her to wait until after she sees him and follow up with the orthotics 4 wks later. She said unless something has changed with Dr Allena Katz she will just keep the appt the way they are and for me to call if needed.

## 2020-07-15 ENCOUNTER — Ambulatory Visit (INDEPENDENT_AMBULATORY_CARE_PROVIDER_SITE_OTHER): Payer: 59 | Admitting: Adult Health

## 2020-07-15 ENCOUNTER — Encounter: Payer: Self-pay | Admitting: Adult Health

## 2020-07-15 DIAGNOSIS — F909 Attention-deficit hyperactivity disorder, unspecified type: Secondary | ICD-10-CM | POA: Diagnosis not present

## 2020-07-15 DIAGNOSIS — F411 Generalized anxiety disorder: Secondary | ICD-10-CM

## 2020-07-15 DIAGNOSIS — F331 Major depressive disorder, recurrent, moderate: Secondary | ICD-10-CM

## 2020-07-15 DIAGNOSIS — G47421 Narcolepsy in conditions classified elsewhere with cataplexy: Secondary | ICD-10-CM

## 2020-07-15 MED ORDER — AMPHETAMINE-DEXTROAMPHETAMINE 20 MG PO TABS: 20.0000 mg | ORAL_TABLET | Freq: Two times a day (BID) | ORAL | 0 refills | Status: DC

## 2020-07-15 MED ORDER — ALPRAZOLAM 0.25 MG PO TABS
0.2500 mg | ORAL_TABLET | Freq: Every evening | ORAL | 0 refills | Status: DC | PRN
Start: 2020-07-15 — End: 2022-07-13

## 2020-07-15 MED ORDER — ARMODAFINIL 150 MG PO TABS: 150.0000 mg | ORAL_TABLET | Freq: Every day | ORAL | 0 refills | Status: DC

## 2020-07-15 MED ORDER — CITALOPRAM HYDROBROMIDE 40 MG PO TABS: 40.0000 mg | ORAL_TABLET | Freq: Every day | ORAL | 3 refills | Status: DC

## 2020-07-15 NOTE — Progress Notes (Signed)
Vanessa Blanchard [id number] 02/17/1971 50 y.o.  Subjective:   Patient ID:  Vanessa Blanchard is a 50 y.o. (DOB 08/14/1970) female.  Chief Complaint: No chief complaint on file.   HPI Vanessa Blanchard presents to the office today for follow-up of anxiety, OCD, depression, and ADHD.  Describes mood today as "ok". Pleasant. Mood symptoms - denies depression, anxiety, and irritability. Stating "I'm doing alright". Feels like current medications work well for her. Working with therapist - Financial controller. Stable interest and motivation. Taking medications as prescribed.  Energy levels stable. Active, does not have a regular exercise routine.  Enjoys some usual interests and activities. Married. Lives with husband and daughter 45 - son in college. Spending time with family -   Appetite adequate. Weight gain - 200 pounds.  Sleeps well most nights. Averages 6 to 8 hours. Focus and concentration stable with Adderall. Completing tasks. Managing aspects of household. 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  Denies SI or HI.  Denies AH or VH.  Review of Systems:  Review of Systems  Musculoskeletal: Negative for gait problem.  Neurological: Negative for tremors.  Psychiatric/Behavioral:       Please refer to HPI    Medications: I have reviewed the patient's current medications.  Current Outpatient Medications  Medication Sig Dispense Refill  . ALPRAZolam (XANAX) 0.25 MG tablet Take 1 tablet (0.25 mg total) by mouth at bedtime as needed for anxiety. 90 tablet 0  . Armodafinil (NUVIGIL) 150 MG tablet Take 1 tablet (150 mg total) by mouth daily. 90 tablet 0  . albuterol (VENTOLIN HFA) 108 (90 Base) MCG/ACT inhaler Inhale 2 puffs into the lungs every 4 (four) hours as needed.    Marland Kitchen amphetamine-dextroamphetamine (ADDERALL) 20 MG tablet Take 1 tablet (20 mg total) by mouth 2 (two) times daily. 180 tablet 0  . cholecalciferol (VITAMIN D3) 25 MCG (1000 UNIT) tablet Take 4,000 Units by mouth daily.    . citalopram  (CELEXA) 40 MG tablet Take 1 tablet (40 mg total) by mouth daily. 90 tablet 3  . levothyroxine (SYNTHROID, LEVOTHROID) 112 MCG tablet Take 112 mcg by mouth daily before breakfast.     . loratadine (CLARITIN) 10 MG tablet Take 10 mg by mouth daily as needed for allergies. OTC    . propranolol (INDERAL) 10 MG tablet 2 (two) times daily as needed. Take 10 mg twice a day     No current facility-administered medications for this visit.    Medication Side Effects: None  Allergies:  Allergies  Allergen Reactions  . Sulfa Antibiotics Rash  . Butorphanol     Other reaction(s): Unknown  . Other Other (See Comments)  . Sulfamethoxazole-Trimethoprim     Other reaction(s): Unknown  . Tetracycline     Other reaction(s): Unknown  . Provigil [Modafinil] Palpitations    Low dose of 150 mg was tolerated , 250 mg was not.     Past Medical History:  Diagnosis Date  . Acne   . Allergic rhinitis   . Anxiety   . Depression   . Depression   . Erythema    nodosum, left lower leg  . Female stress incontinence   . Granuloma annulare   . Hirsutism   . Hyperhidrosis   . Hypothyroidism   . 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 Nephrolithiasis    right  . Obesity   . 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 TMJ (dislocation of temporomandibular joint)   .  Varicose veins    BOTH LEGS, worsening,painfulk, leg fatigue extreme at times  . Vitamin D deficiency     Past Medical History, Surgical history, Social history, and Family history were reviewed and updated as appropriate.   Please see review of systems for further details on the patient's review from today.   Objective:   Physical Exam:  There were no vitals taken for this visit.  Physical Exam Constitutional:      General: She is not in acute distress. Musculoskeletal:        General: No deformity.  Neurological:     Mental Status: She is alert and oriented to person, place, and time.     Coordination:  Coordination normal.  Psychiatric:        Attention and Perception: Attention and perception normal. She does not perceive auditory or visual hallucinations.        Mood and Affect: Mood normal. Mood is not anxious or depressed. Affect is not labile, blunt, angry or inappropriate.        Speech: Speech normal.        Behavior: Behavior normal.        Thought Content: Thought content normal. Thought content is not paranoid or delusional. Thought content does not include homicidal or suicidal ideation. Thought content does not include homicidal or suicidal plan.        Cognition and Memory: Cognition and memory normal.        Judgment: Judgment normal.     Comments: Insight intact     Lab Review:     Component Value Date/Time   NA 139 06/15/2019 0832   K 4.6 06/15/2019 0832   CL 102 06/15/2019 0832   CO2 23 06/15/2019 0832   GLUCOSE 102 (H) 06/15/2019 0832   BUN 15 06/15/2019 0832   CREATININE 0.72 06/15/2019 0832   CALCIUM 9.5 06/15/2019 0832   PROT 7.1 06/28/2012 0927   ALBUMIN 4.4 06/28/2012 0927   AST 14 06/28/2012 0927   ALT 12 06/28/2012 0927   ALKPHOS 76 06/28/2012 0927   BILITOT 0.3 06/28/2012 0927   GFRNONAA 101 06/15/2019 0832   GFRAA 116 06/15/2019 0832       Component Value Date/Time   WBC 5.1 06/28/2012 0927   WBC 8.2 12/27/2008 0535   RBC 4.29 06/28/2012 0927   RBC 3.43 (L) 12/27/2008 0535   HGB 12.8 06/28/2012 0927   HCT 39.0 06/28/2012 0927   PLT 291 06/28/2012 0927   MCV 91 06/28/2012 0927   MCH 29.8 06/28/2012 0927   MCHC 32.8 06/28/2012 0927   MCHC 34.8 12/27/2008 0535   RDW 13.5 06/28/2012 0927    No results found for: POCLITH, LITHIUM   No results found for: PHENYTOIN, PHENOBARB, VALPROATE, CBMZ   .res Assessment: Plan:    Plan:  1. Celexa 40mg  daily 2. Adderall 20mg  BID 3. Xanax 0.25mg  daily as needed  136/79 74  RTC 6 months to a year.  Patient advised to contact office with any questions, adverse effects, or acute worsening in  signs and symptoms.  Discussed potential benefits, risks, and side effects of stimulants with patient to include increased heart rate, palpitations, insomnia, increased anxiety, increased irritability, or decreased appetite.  Instructed patient to contact office if experiencing any significant tolerability issues.     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  Generalized anxiety disorder -     citalopram (CELEXA) 40 MG tablet; Take 1 tablet (40 mg total) by mouth daily. -     ALPRAZolam (XANAX) 0.25 MG tablet; Take 1 tablet (0.25 mg total) by mouth at bedtime as needed for anxiety.  Major depressive disorder, recurrent episode, moderate (HCC) -     citalopram (CELEXA) 40 MG tablet; Take 1 tablet (40 mg total) by mouth daily.  Attention deficit hyperactivity disorder (ADHD), unspecified ADHD type -     amphetamine-dextroamphetamine (ADDERALL) 20 MG tablet; Take 1 tablet (20 mg total) by mouth 2 (two) times daily.     Please see After Visit Summary for patient specific instructions.  Future Appointments  Date Time Provider Department Center  07/17/2020  1:45 PM Candelaria Stagers, DPM [facility]  08/02/2020 10:15 AM [facility]    No orders of the defined types were placed in this encounter.   -------------------------------

## 2020-07-17 ENCOUNTER — Ambulatory Visit: Payer: 59 | Admitting: Podiatry

## 2020-07-17 ENCOUNTER — Other Ambulatory Visit: Payer: Self-pay

## 2020-07-17 DIAGNOSIS — M722 Plantar fascial fibromatosis: Secondary | ICD-10-CM

## 2020-07-18 ENCOUNTER — Encounter: Payer: Self-pay | Admitting: Podiatry

## 2020-07-18 ENCOUNTER — Telehealth: Payer: Self-pay | Admitting: Podiatry

## 2020-07-18 NOTE — Telephone Encounter (Signed)
Patient called stating she was seen in our office yesterday and she was place in a cam boot . She states the boot is uncomfortable and she wants to know if there is another boot she can try. She also states that she has tried  the extra padding and to inflate the boot to make it more comfortable but nothing is working.

## 2020-07-18 NOTE — Progress Notes (Signed)
Subjective:  Patient ID: Vanessa Blanchard, female    DOB: Jun 07, 1970,  MRN: [id number]  Chief Complaint  Patient presents with  . Plantar Fasciitis     4 week fu bilat plantar fasciitis pain, right foot is worse     50 y.o. female presents with the above complaint.  Patient presents with follow-up of bilateral plantar fasciitis.  She said the right side is worse.  The left side is doing a much better.  She is worried that the left side may acting because of compensation.  She states the injection only worked for a week.  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  The orthotics have not been helpful.   Review of Systems: Negative except as noted in the HPI. Denies N/V/F/Ch.  Past Medical History:  Diagnosis Date  . Acne   . Allergic rhinitis   . Anxiety   . Depression   . Depression   . Erythema    nodosum, left lower leg  . Female stress incontinence   . Granuloma annulare   . Hirsutism   . Hyperhidrosis   . Hypothyroidism   . 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 Nephrolithiasis    right  . Obesity   . 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 TMJ (dislocation of temporomandibular joint)   . Varicose veins    BOTH LEGS, worsening,painfulk, leg fatigue extreme at times  . Vitamin D deficiency     Current Outpatient Medications:  .  albuterol (VENTOLIN HFA) 108 (90 Base) MCG/ACT inhaler, Inhale 2 puffs into the lungs every 4 (four) hours as needed., Disp: , Rfl:  .  ALPRAZolam (XANAX) 0.25 MG tablet, Take 1 tablet (0.25 mg total) by mouth at bedtime as needed for anxiety., Disp: 90 tablet, Rfl: 0 .  amphetamine-dextroamphetamine (ADDERALL) 20 MG tablet, Take 1 tablet (20 mg total) by mouth 2 (two) times daily., Disp: 180 tablet, Rfl: 0 .  Armodafinil (NUVIGIL) 150 MG tablet, Take 1 tablet (150 mg total) by mouth daily., Disp: 90 tablet, Rfl: 0 .  cholecalciferol (VITAMIN D3) 25 MCG (1000 UNIT) tablet, Take 4,000 Units by mouth  daily., Disp: , Rfl:  .  citalopram (CELEXA) 40 MG tablet, Take 1 tablet (40 mg total) by mouth daily., Disp: 90 tablet, Rfl: 3 .  levothyroxine (SYNTHROID, LEVOTHROID) 112 MCG tablet, Take 112 mcg by mouth daily before breakfast. , Disp: , Rfl:  .  loratadine (CLARITIN) 10 MG tablet, Take 10 mg by mouth daily as needed for allergies. OTC, Disp: , Rfl:  .  propranolol (INDERAL) 10 MG tablet, 2 (two) times daily as needed. Take 10 mg twice a day, Disp: , Rfl:   Social History   Tobacco Use  Smoking Status Never Smoker  Smokeless Tobacco Never Used    Allergies  Allergen Reactions  . Sulfa Antibiotics Rash  . Butorphanol     Other reaction(s): Unknown  . Other Other (See Comments)  . Sulfamethoxazole-Trimethoprim     Other reaction(s): Unknown  . Tetracycline     Other reaction(s): Unknown  . Provigil [Modafinil] Palpitations    Low dose of 150 mg was tolerated , 250 mg was not.    Objective:  There were no vitals filed for this visit. There is no height or weight on file to calculate BMI. Constitutional Well developed. Well nourished.  Vascular Dorsalis pedis pulses palpable bilaterally. Posterior tibial pulses palpable bilaterally. Capillary refill normal to all digits.  No cyanosis or clubbing noted. Pedal hair growth normal.  Neurologic Normal speech. Oriented to person, place, and time. Epicritic sensation to light touch grossly present bilaterally.  Dermatologic Nails well groomed and normal in appearance. No open wounds. No skin lesions.  Orthopedic: Normal joint ROM without pain or crepitus bilaterally. No visible deformities. Tender to palpation at the calcaneal tuber bilaterally. No pain with calcaneal squeeze bilaterally. Ankle ROM diminished range of motion bilaterally. Silfverskiold Test: positive bilaterally.   Radiographs: Taken and reviewed. No acute fractures or dislocations. No evidence of stress fracture.  Plantar heel spur present. Posterior heel  spur present.   Assessment:   1. Plantar fasciitis of right foot    Plan:  Patient was evaluated and treated and all questions answered.  Plantar Fasciitis, right greater than left. - XR reviewed as above.  - Educated on icing and stretching. Instructions given.  - Injection delivered to the plantar fascia as below. - DME: Cam boot on the right side - Pharmacologic management: None  Pes planovalgus -I explained the patient the etiology of pes planovalgus and various treatment options were discussed.  Patient had orthotics made by [facility].  However they do not seem to be functioning correctly.  I would like for her to undergo new pair of orthotics by obtaining a new impression.  We will plan on getting new orthotics for her.  Procedure: Injection Tendon/Ligament Location: Bilateral plantar fascia at the glabrous junction; medial approach. Skin Prep: alcohol Injectate: 0.5 cc 0.5% marcaine plain, 0.5 cc of 1% Lidocaine, 0.5 cc kenalog 10. Disposition: Patient tolerated procedure well. Injection site dressed with a band-aid.  No follow-ups on file.

## 2020-07-19 NOTE — Telephone Encounter (Signed)
Unfortunately the boots are standard and generic. Let her know do the best she can. Thank you

## 2020-07-19 NOTE — Telephone Encounter (Signed)
I called Vanessa Blanchard back this morning she wants to know if she can get a taller boot.Because the smaller boot is uncomfortable on her foot and digging into her feet.

## 2020-07-19 NOTE — Telephone Encounter (Signed)
I called Vanessa Blanchard back to let her know Dr.patel said she can come to our office to pick up a taller boot.

## 2020-07-19 NOTE — Telephone Encounter (Signed)
That is fine. She can pick it up at office and have one of the MA give it to her

## 2020-08-01 ENCOUNTER — Telehealth: Payer: Self-pay | Admitting: *Deleted

## 2020-08-01 NOTE — Telephone Encounter (Signed)
Patient is calling because her boot's pump is no longer working properly,which allows her foot to move around and not remain in position.  Please call patient ,is she is able to come in after 3:30 today to have someone look at and adjust if possible.Please advise.

## 2020-08-01 NOTE — Telephone Encounter (Signed)
Completed note 

## 2020-08-02 ENCOUNTER — Other Ambulatory Visit: Payer: 59

## 2020-08-16 ENCOUNTER — Other Ambulatory Visit: Payer: Self-pay

## 2020-08-16 ENCOUNTER — Ambulatory Visit (INDEPENDENT_AMBULATORY_CARE_PROVIDER_SITE_OTHER): Payer: 59 | Admitting: Podiatry

## 2020-08-16 DIAGNOSIS — M722 Plantar fascial fibromatosis: Secondary | ICD-10-CM

## 2020-08-20 ENCOUNTER — Encounter: Payer: Self-pay | Admitting: Podiatry

## 2020-08-20 NOTE — Progress Notes (Signed)
Subjective:  Patient ID: Vanessa Blanchard, female    DOB: February 08, 1971,  MRN: [id number]  Chief Complaint  Patient presents with  . Plantar Fasciitis    PT stated that she has had some improvement but is still having some pain.    50 y.o. female presents with the above complaint.  Patient presents with complaint of bilateral plantar fasciitis.  She states that she is has some improvement but there is still some residual pain.  She is interested in PRP injection.  Review of Systems: Negative except as noted in the HPI. Denies N/V/F/Ch.  Past Medical History:  Diagnosis Date  . Acne   . Allergic rhinitis   . Anxiety   . Depression   . Depression   . Erythema    nodosum, left lower leg  . Female stress incontinence   . Granuloma annulare   . Hirsutism   . Hyperhidrosis   . Hypothyroidism   . 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 Nephrolithiasis    right  . Obesity   . 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 TMJ (dislocation of temporomandibular joint)   . Varicose veins    BOTH LEGS, worsening,painfulk, leg fatigue extreme at times  . Vitamin D deficiency     Current Outpatient Medications:  .  albuterol (VENTOLIN HFA) 108 (90 Base) MCG/ACT inhaler, Inhale 2 puffs into the lungs every 4 (four) hours as needed., Disp: , Rfl:  .  ALPRAZolam (XANAX) 0.25 MG tablet, Take 1 tablet (0.25 mg total) by mouth at bedtime as needed for anxiety., Disp: 90 tablet, Rfl: 0 .  amphetamine-dextroamphetamine (ADDERALL) 20 MG tablet, Take 1 tablet (20 mg total) by mouth 2 (two) times daily., Disp: 180 tablet, Rfl: 0 .  Armodafinil (NUVIGIL) 150 MG tablet, Take 1 tablet (150 mg total) by mouth daily., Disp: 90 tablet, Rfl: 0 .  cholecalciferol (VITAMIN D3) 25 MCG (1000 UNIT) tablet, Take 4,000 Units by mouth daily., Disp: , Rfl:  .  citalopram (CELEXA) 40 MG tablet, Take 1 tablet (40 mg total) by mouth daily., Disp: 90 tablet, Rfl: 3 .  levothyroxine (SYNTHROID,  LEVOTHROID) 112 MCG tablet, Take 112 mcg by mouth daily before breakfast. , Disp: , Rfl:  .  loratadine (CLARITIN) 10 MG tablet, Take 10 mg by mouth daily as needed for allergies. OTC, Disp: , Rfl:  .  propranolol (INDERAL) 10 MG tablet, 2 (two) times daily as needed. Take 10 mg twice a day, Disp: , Rfl:   Social History   Tobacco Use  Smoking Status Never Smoker  Smokeless Tobacco Never Used    Allergies  Allergen Reactions  . Sulfa Antibiotics Rash  . Butorphanol     Other reaction(s): Unknown  . Other Other (See Comments)  . Sulfamethoxazole-Trimethoprim     Other reaction(s): Unknown  . Tetracycline     Other reaction(s): Unknown  . Provigil [Modafinil] Palpitations    Low dose of 150 mg was tolerated , 250 mg was not.    Objective:  There were no vitals filed for this visit. There is no height or weight on file to calculate BMI. Constitutional Well developed. Well nourished.  Vascular Dorsalis pedis pulses palpable bilaterally. Posterior tibial pulses palpable bilaterally. Capillary refill normal to all digits.  No cyanosis or clubbing noted. Pedal hair growth normal.  Neurologic Normal speech. Oriented to person, place, and time. Epicritic sensation to light touch grossly present bilaterally.  Dermatologic Nails well groomed and normal in appearance.  No open wounds. No skin lesions.  Orthopedic: Normal joint ROM without pain or crepitus bilaterally. No visible deformities. Tender to palpation at the calcaneal tuber bilaterally. No pain with calcaneal squeeze bilaterally. Ankle ROM diminished range of motion bilaterally. Silfverskiold Test: positive bilaterally.   Radiographs: Taken and reviewed. No acute fractures or dislocations. No evidence of stress fracture.  Plantar heel spur present. Posterior heel spur present.   Assessment:   No diagnosis found. Plan:  Patient was evaluated and treated and all questions answered.  Plantar Fasciitis, right greater  than left. - XR reviewed as above.  - Educated on icing and stretching. Instructions given.  -No further injection delivered to the plantar fascia as below. - DME: Continue utilizing boot on the right side - Pharmacologic management: None -Patient will be candidate for PRP injection.  I discussed the injection briefly.  We will plan on setting of to be scheduled for PRP injection  Pes planovalgus -I explained the patient the etiology of pes planovalgus and various treatment options were discussed.  Patient had orthotics made by [facility].  However they do not seem to be functioning correctly.  I would like for her to undergo new pair of orthotics by obtaining a new impression.  We will plan on getting new orthotics for her.  No follow-ups on file.

## 2020-08-27 ENCOUNTER — Telehealth: Payer: Self-pay | Admitting: *Deleted

## 2020-08-27 NOTE — Telephone Encounter (Signed)
Patient is calling,wanted to let Dr Allena Katz know that she has started  her 3rd session of PT, wants to hold off on the appointment for PRP for now. Please advise.

## 2020-08-27 NOTE — Telephone Encounter (Signed)
That is completely fine.  She can schedule an appointment to see me after physical therapy has been completed.  Keely we can cancel the appointment for PRP for this patient

## 2020-08-27 NOTE — Telephone Encounter (Signed)
Pt has been canceled.

## 2020-08-30 ENCOUNTER — Other Ambulatory Visit: Payer: 59

## 2020-09-11 ENCOUNTER — Ambulatory Visit: Payer: 59

## 2020-09-11 ENCOUNTER — Ambulatory Visit: Payer: 59 | Admitting: Podiatry

## 2020-09-11 ENCOUNTER — Other Ambulatory Visit: Payer: Self-pay

## 2020-09-11 DIAGNOSIS — M25372 Other instability, left ankle: Secondary | ICD-10-CM | POA: Diagnosis not present

## 2020-09-11 DIAGNOSIS — M25371 Other instability, right ankle: Secondary | ICD-10-CM | POA: Diagnosis not present

## 2020-09-11 DIAGNOSIS — M722 Plantar fascial fibromatosis: Secondary | ICD-10-CM | POA: Diagnosis not present

## 2020-09-17 ENCOUNTER — Other Ambulatory Visit: Payer: Self-pay

## 2020-09-17 ENCOUNTER — Encounter: Payer: Self-pay | Admitting: Podiatry

## 2020-09-17 ENCOUNTER — Ambulatory Visit (INDEPENDENT_AMBULATORY_CARE_PROVIDER_SITE_OTHER): Payer: 59 | Admitting: Adult Health

## 2020-09-17 ENCOUNTER — Encounter: Payer: Self-pay | Admitting: Adult Health

## 2020-09-17 DIAGNOSIS — G47421 Narcolepsy in conditions classified elsewhere with cataplexy: Secondary | ICD-10-CM

## 2020-09-17 DIAGNOSIS — F909 Attention-deficit hyperactivity disorder, unspecified type: Secondary | ICD-10-CM

## 2020-09-17 DIAGNOSIS — F331 Major depressive disorder, recurrent, moderate: Secondary | ICD-10-CM

## 2020-09-17 DIAGNOSIS — F411 Generalized anxiety disorder: Secondary | ICD-10-CM | POA: Diagnosis not present

## 2020-09-17 MED ORDER — BUPROPION HCL ER (XL) 150 MG PO TB24
ORAL_TABLET | ORAL | 3 refills | Status: DC
Start: 2020-09-17 — End: 2020-09-17

## 2020-09-17 MED ORDER — BUPROPION HCL ER (XL) 150 MG PO TB24
ORAL_TABLET | ORAL | 0 refills | Status: DC
Start: 2020-09-17 — End: 2020-10-14

## 2020-09-17 NOTE — Progress Notes (Signed)
Vanessa Blanchard [id number] 1970/10/04 50 y.o.  Subjective:   Patient ID:  Vanessa Blanchard is a 50 y.o. (DOB [birth year]) female.  Chief Complaint: No chief complaint on file.   HPI ARPITA FENTRESS presents to the office today for follow-up of anxiety, OCD, depression, and ADHD.  Describes mood today as "not the best". Pleasant. Tearful at times. Mood symptoms - reports depression - "more emotional". Denies anxiety and irritability. Stating "I'm not doing as well as I was". Feels like current medications are helpful, but may need an adjustment.  Working with therapist - Financial controller. Stable interest and motivation. Taking medications as prescribed.  Energy levels stable. Active, does not have a regular exercise routine with current physical limitations - plantar fascitis.  Enjoys some usual interests and activities. Married. Lives with husband and daughter 41 - son in college. Spending time with family.  Appetite adequate. Weight gain - 212 pounds.  Sleeps well most nights. Averages 8 to 9 hours. Focus and concentration stable with Adderall. Completing tasks. Managing aspects of household. 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  Denies SI or HI.  Denies AH or VH.    Review of Systems:  Review of Systems  Musculoskeletal:  Negative for gait problem.  Neurological:  Negative for tremors.  Psychiatric/Behavioral:         Please refer to HPI   Medications: I have reviewed the patient's current medications.  Current Outpatient Medications  Medication Sig Dispense Refill   albuterol (VENTOLIN HFA) 108 (90 Base) MCG/ACT inhaler Inhale 2 puffs into the lungs every 4 (four) hours as needed.     ALPRAZolam (XANAX) 0.25 MG tablet Take 1 tablet (0.25 mg total) by mouth at bedtime as needed for anxiety. 90 tablet 0   amphetamine-dextroamphetamine (ADDERALL) 20 MG tablet Take 1 tablet (20 mg total) by mouth 2 (two) times daily. 180 tablet 0   Armodafinil (NUVIGIL) 150 MG tablet Take 1 tablet (150 mg total) by  mouth daily. 90 tablet 0   buPROPion (WELLBUTRIN XL) 150 MG 24 hr tablet Take one tablet every morning for 7 days, then increase to two tablets every morning. 60 tablet 0   cholecalciferol (VITAMIN D3) 25 MCG (1000 UNIT) tablet Take 4,000 Units by mouth daily.     citalopram (CELEXA) 40 MG tablet Take 1 tablet (40 mg total) by mouth daily. 90 tablet 3   levothyroxine (SYNTHROID, LEVOTHROID) 112 MCG tablet Take 112 mcg by mouth daily before breakfast.      loratadine (CLARITIN) 10 MG tablet Take 10 mg by mouth daily as needed for allergies. OTC     propranolol (INDERAL) 10 MG tablet 2 (two) times daily as needed. Take 10 mg twice a day     No current facility-administered medications for this visit.    Medication Side Effects: None  Allergies:  Allergies  Allergen Reactions   Sulfa Antibiotics Rash   Butorphanol     Other reaction(s): Unknown   Other Other (See Comments)   Sulfamethoxazole-Trimethoprim     Other reaction(s): Unknown   Tetracycline     Other reaction(s): Unknown   Provigil [Modafinil] Palpitations    Low dose of 150 mg was tolerated , 250 mg was not.     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   Please see review of systems for further details on the patient's review from today.   Objective:   Physical Exam:  There were no vitals taken  for this visit.  Physical Exam Constitutional:      General: She is not in acute distress. Musculoskeletal:        General: No deformity.  Neurological:     Mental Status: She is alert and oriented to person, place, and time.     Coordination: Coordination normal.  Psychiatric:        Attention and Perception: Attention and perception normal. She does not perceive auditory or visual hallucinations.        Mood and Affect: Mood normal. Mood is not anxious or depressed. Affect is not labile, blunt, angry or inappropriate.        Speech: Speech normal.        Behavior: Behavior normal.        Thought Content: Thought content normal. Thought content is not paranoid or delusional. Thought content does not include homicidal or suicidal ideation. Thought content does not include homicidal or suicidal plan.        Cognition and Memory: Cognition and memory normal.        Judgment: Judgment normal.     Comments: Insight intact    Lab Review:     Component Value Date/Time   NA 139 06/15/2019 0832   K 4.6 06/15/2019 0832   CL 102 06/15/2019 0832   CO2 23 06/15/2019 0832   GLUCOSE 102 (H) 06/15/2019 0832   BUN 15 06/15/2019 0832   CREATININE 0.72 06/15/2019 0832   CALCIUM 9.5 06/15/2019 0832   PROT 7.1 06/28/2012 0927   ALBUMIN 4.4 06/28/2012 0927   AST 14 06/28/2012 0927   ALT 12 06/28/2012 0927   ALKPHOS 76 06/28/2012 0927   BILITOT 0.3 06/28/2012 0927   GFRNONAA 101 06/15/2019 0832   GFRAA 116 06/15/2019 0832       Component Value Date/Time   WBC 5.1 06/28/2012 0927   WBC 8.2 12/27/2008 0535   RBC 4.29 06/28/2012 0927   RBC 3.43 (L) 12/27/2008 0535   HGB 12.8 06/28/2012 0927   HCT 39.0 06/28/2012 0927   PLT 291 06/28/2012 0927   MCV 91 06/28/2012 0927   MCH 29.8 06/28/2012 0927   MCHC 32.8 06/28/2012 0927   MCHC 34.8 12/27/2008 0535   RDW 13.5 06/28/2012 0927    No results found for: POCLITH, LITHIUM   No results found for: PHENYTOIN, PHENOBARB, VALPROATE, CBMZ    .res Assessment: Plan:     Plan:  1. Celexa 40mg  daily 2. D/C Adderall 20mg  BID 3. Xanax 0.25mg  daily as needed 4. Recently started Nuvigil 150mg  daily 5. Add Wellbutrin XL 150mg  daily x 7 days, then increase to 300mg  daily. Denies seizure history.  143/90 - 76  RTC 4 weeks  Patient advised to contact office with any questions, adverse effects, or acute worsening in signs and  symptoms.  Discussed potential benefits, risks, and side effects of stimulants with patient to include increased heart rate, palpitations, insomnia, increased anxiety, increased irritability, or decreased appetite.  Instructed patient to contact office if experiencing any significant tolerability issues.  Diagnoses and all orders for this visit:  Major depressive disorder, recurrent episode, moderate (HCC) -     Discontinue: buPROPion (WELLBUTRIN XL) 150 MG 24 hr tablet; Take one tablet every morning for 7 days, then increase to two tablets every morning. -     buPROPion (WELLBUTRIN XL) 150 MG 24 hr tablet; Take one tablet every morning for 7 days, then increase to two tablets every morning.  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  Future Appointments  Date Time Provider Department Center  10/04/2020  1:15 PM [facility]  10/09/2020 10:00 AM [facility] NURSE [facility]  01/15/2021 11:20 AM Hunter Pinkard, Thereasa Solo, NP [facility] None    No orders of the defined types were placed in this encounter.   -------------------------------

## 2020-09-17 NOTE — Progress Notes (Signed)
Subjective:  Patient ID: Vanessa Blanchard, female    DOB: 1970/10/29,  MRN: [id number]  Chief Complaint  Patient presents with   Plantar Fasciitis    Plantar fasciitis follow up  She stated that physical therapy is going well     50 y.o. female presents with the above complaint.  Patient presents with complaint of bilateral plantar fasciitis.  She states that she is has some improvement but there is still some residual pain.  She will wants to be scheduled for PRP injection.  She also has secondary complaint of ankle instability to both ankles.  She states that when she is walking she feels like the ankles then give out on her.  She does have a history of ankle sprains in the past.  Review of Systems: Negative except as noted in the HPI. Denies N/V/F/Ch.  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   TMJ (dislocation of temporomandibular joint)    Varicose veins    BOTH LEGS, worsening,painfulk, leg fatigue extreme at times   Vitamin D deficiency     Current Outpatient Medications:    albuterol (VENTOLIN HFA) 108 (90 Base) MCG/ACT inhaler, Inhale 2 puffs into the lungs every 4 (four) hours as needed., Disp: , Rfl:    ALPRAZolam (XANAX) 0.25 MG tablet, Take 1 tablet (0.25 mg total) by mouth at bedtime as needed for anxiety., Disp: 90 tablet, Rfl: 0   amphetamine-dextroamphetamine (ADDERALL) 20 MG tablet, Take 1 tablet (20 mg total) by mouth 2 (two) times daily., Disp: 180 tablet, Rfl: 0   Armodafinil (NUVIGIL) 150 MG tablet, Take 1 tablet (150 mg total) by mouth daily., Disp: 90 tablet, Rfl: 0   buPROPion (WELLBUTRIN XL) 150 MG 24 hr tablet,  Take one tablet every morning for 7 days, then increase to two tablets every morning., Disp: 60 tablet, Rfl: 0   cholecalciferol (VITAMIN D3) 25 MCG (1000 UNIT) tablet, Take 4,000 Units by mouth daily., Disp: , Rfl:    citalopram (CELEXA) 40 MG tablet, Take 1 tablet (40 mg total) by mouth daily., Disp: 90 tablet, Rfl: 3   levothyroxine (SYNTHROID, LEVOTHROID) 112 MCG tablet, Take 112 mcg by mouth daily before breakfast. , Disp: , Rfl:    loratadine (CLARITIN) 10 MG tablet, Take 10 mg by mouth daily as needed for allergies. OTC, Disp: , Rfl:    propranolol (INDERAL) 10 MG tablet, 2 (two) times daily as needed. Take 10 mg twice a day, Disp: , Rfl:   Social History   Tobacco Use  Smoking Status Never  Smokeless Tobacco Never    Allergies  Allergen Reactions   Sulfa Antibiotics Rash   Butorphanol     Other reaction(s): Unknown   Other Other (See Comments)   Sulfamethoxazole-Trimethoprim     Other reaction(s): Unknown   Tetracycline     Other reaction(s): Unknown   Provigil [Modafinil] Palpitations    Low dose of 150 mg was tolerated , 250 mg was not.    Objective:  There were no vitals filed for  this visit. There is no height or weight on file to calculate BMI. Constitutional Well developed. Well nourished.  Vascular Dorsalis pedis pulses palpable bilaterally. Posterior tibial pulses palpable bilaterally. Capillary refill normal to all digits.  No cyanosis or clubbing noted. Pedal hair growth normal.  Neurologic Normal speech. Oriented to person, place, and time. Epicritic sensation to light touch grossly present bilaterally.  Dermatologic Nails well groomed and normal in appearance. No open wounds. No skin lesions.  Orthopedic: Normal joint ROM without pain or crepitus bilaterally. No visible deformities. Tender to palpation at the calcaneal tuber bilaterally. No pain with calcaneal squeeze bilaterally. Ankle ROM diminished range of motion bilaterally. Silfverskiold Test:  positive bilaterally.  Marchase of ankle instability noted to bilateral ankle.  Negative anterior drawer test.  Negative talar tilt test.  Positive Romberg's test   Radiographs: Taken and reviewed. No acute fractures or dislocations. No evidence of stress fracture.  Plantar heel spur present. Posterior heel spur present.   Assessment:   No diagnosis found. Plan:  Patient was evaluated and treated and all questions answered.  Bilateral ankle instability -Explained the patient the etiology of ankle instability and various treatment options were discussed.  Patient does have history of chronic ankle sprains.  Given that she is not hurting but feeling instability I believe she will benefit from Tri-Lock ankle brace.  Tri-Lock ankle brace were dispensed right ankle instability  Plantar Fasciitis, right greater than left. - XR reviewed as above.  - Educated on icing and stretching. Instructions given.  -No further injection delivered to the plantar fascia as below. - DME: Continue utilizing boot on the right side - Pharmacologic management: None -Patient will be candidate for PRP injection.  I discussed the injection briefly.  We will plan on setting of to be scheduled for PRP injection  Pes planovalgus -I explained the patient the etiology of pes planovalgus and various treatment options were discussed.  Patient had orthotics made by [facility].  However they do not seem to be functioning correctly.  I would like for her to undergo new pair of orthotics by obtaining a new impression.  We will plan on getting new orthotics for her.  No follow-ups on file.

## 2020-10-03 ENCOUNTER — Other Ambulatory Visit: Payer: Self-pay

## 2020-10-03 ENCOUNTER — Ambulatory Visit: Payer: 59 | Admitting: Sports Medicine

## 2020-10-03 DIAGNOSIS — M722 Plantar fascial fibromatosis: Secondary | ICD-10-CM | POA: Insufficient documentation

## 2020-10-03 NOTE — Progress Notes (Signed)
Chief complaint chronic plantar fasciitis on the right and bilateral foot pain  Patient has been treated by podiatry She has had chronic plantar fascial pain symptoms On the right this is persisted for a year On the left this is been worse over the past 3 months For step in the morning or getting up out of the chair is very painful  She had 2 cortisone injections to her right foot but they only gave her 1 week of pain relief She had a rigid three-quarter length orthotic made but it was very uncomfortable  She has tried multiple types of shoes but none of them give her any prolonged relief  This limits her ability to work or stand on her feet for very long periods of time  She is considering getting PRP injections at podiatry but wanted a second opinion  Physical exam Pleasant female in no acute distress BP (!) 141/86   Ht 5\' 5"  (1.651 m)   Wt 200 lb (90.7 kg)   BMI 33.28 kg/m  No flowsheet data found.  Foot shape shows that she has a preserved longitudinal arch but with some collapse She stands and about 10 degrees of pronation bilaterally at the midfoot She has a mild Morton's change with the second toe being longer than the right The great toe on the right and left has good motion On the right foot she has moderate tenderness to palpation at the medial calcaneus On the left there is no pain at the calcaneus but some pain on palpation of the arch Transverse arch still appears intact  Walking gait shows mild pronation bilaterally  POCUS Right plantar fascial visualized with a small spur There is hypoechoic change at the insertion The width is approximately 0.6 cm  Left plantar fascia appears normal With is only 0.46 cm No spurring noted  Impression is chronic right plantar fasciitis and left longitudinal arch strain

## 2020-10-03 NOTE — Patient Instructions (Signed)
It was great to see you today!  -Please use the orthotics we made you today. -Use ice a couple times a day for 15 minutes. -Do the exercises and stretches we showed you today. -Follow up with [place] in 4 weeks.

## 2020-10-03 NOTE — Assessment & Plan Note (Signed)
We discussed several options today I advised that I do not think corticosteroid injection would help since this is already failed I think a softer full with cushion orthotic might be more helpful PRP is safe but the studies have not proven a clinical benefit yet I think it is reasonable to try longer term conservative care before considering surgery Extracorporal shockwave therapy is also an option  After discussion we chose to make her a cushioned orthotic today Home stretching and exercise program Recheck after 1 month and see if this is starting to give her some relief  Patient was fitted for a : standard, cushioned, semi-rigid orthotic. The orthotic was heated and afterward the patient stood on the orthotic blank positioned on the orthotic stand. The patient was positioned in subtalar neutral position and 10 degrees of ankle dorsiflexion in a weight bearing stance. After completion of molding, a stable base was applied to the orthotic blank. The blank was ground to a stable position for weight bearing. Size: 9 red Med EVA Base: Blue medium eva Posting: RT medial heel wedge Additional orthotic padding: None  At completion of the orthotic she was able to walk with a neutral gait She had complete pain relief on the left and some improvement on the right.  She will try these over the next month and see if they are starting to give her enough benefit

## 2020-10-04 ENCOUNTER — Other Ambulatory Visit: Payer: 59

## 2020-10-09 ENCOUNTER — Ambulatory Visit: Payer: 59

## 2020-10-12 ENCOUNTER — Other Ambulatory Visit: Payer: Self-pay | Admitting: Adult Health

## 2020-10-12 DIAGNOSIS — F331 Major depressive disorder, recurrent, moderate: Secondary | ICD-10-CM

## 2020-10-17 ENCOUNTER — Encounter: Payer: Self-pay | Admitting: Adult Health

## 2020-10-17 ENCOUNTER — Telehealth (INDEPENDENT_AMBULATORY_CARE_PROVIDER_SITE_OTHER): Payer: 59 | Admitting: Adult Health

## 2020-10-17 DIAGNOSIS — F909 Attention-deficit hyperactivity disorder, unspecified type: Secondary | ICD-10-CM | POA: Diagnosis not present

## 2020-10-17 DIAGNOSIS — F411 Generalized anxiety disorder: Secondary | ICD-10-CM | POA: Diagnosis not present

## 2020-10-17 DIAGNOSIS — F331 Major depressive disorder, recurrent, moderate: Secondary | ICD-10-CM

## 2020-10-17 NOTE — Progress Notes (Signed)
Vanessa Blanchard [id number] 17-Feb-1971 50 y.o.  Virtual Visit via Telephone Note  I connected with pt on 10/17/20 at  1:00 PM EDT by telephone and verified that I am speaking with the correct person using two identifiers.   I discussed the limitations, risks, security and privacy concerns of performing an evaluation and management service by telephone and the availability of in person appointments. I also discussed with the patient that there may be a patient responsible charge related to this service. The patient expressed understanding and agreed to proceed.   I discussed the assessment and treatment plan with the patient. The patient was provided an opportunity to ask questions and all were answered. The patient agreed with the plan and demonstrated an understanding of the instructions.   The patient was advised to call back or seek an in-person evaluation if the symptoms worsen or if the condition fails to improve as anticipated.  I provided 20 minutes of non-face-to-face time during this encounter.  The patient was located at home.  The provider was located at [facility].   Vanessa Gibbs, NP   Subjective:   Patient ID:  Vanessa Blanchard is a 50 y.o. (DOB 1971-02-21) female.  Chief Complaint: No chief complaint on file.  HPI Vanessa Blanchard presents for follow-up of anxiety, depression, and ADHD.  Describes mood today as "a little bit". Pleasant. Tearful at times. Mood symptoms - reports decreased depression - "not as bad". Denies anxiety and irritability. Stating "I think I'm a little better". Recovering from Covid - "felt like a bad head cold". Has lost some friends. Husband's mother needing more surgeries. Feels like current medications are helpful.  Working with therapist - Financial controller. Stable interest and motivation. Taking medications as prescribed.  Energy levels stable. Active, does not have a regular exercise routine. Enjoys some usual interests and activities.  Married. Lives with husband and daughter 49 - son in college. Spending time with family.  Appetite adequate. Weight stable - 212 pounds.  Sleeps well most nights. Averages 8 to 9 hours. Focus and concentration stable with Adderall. Completing tasks. Managing aspects of household. 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  Denies SI or HI.  Denies AH or VH.   Review of Systems:  Review of Systems  Musculoskeletal:  Negative for gait problem.  Neurological:  Negative for tremors.  Psychiatric/Behavioral:         Please refer to HPI   Medications: I have reviewed the patient's current medications.  Current Outpatient Medications  Medication Sig Dispense Refill   albuterol (VENTOLIN HFA) 108 (90 Base) MCG/ACT inhaler Inhale 2 puffs into the lungs every 4 (four) hours as needed.     ALPRAZolam (XANAX) 0.25 MG tablet Take 1 tablet (0.25 mg total) by mouth at bedtime as needed for anxiety. 90 tablet 0   amphetamine-dextroamphetamine (ADDERALL) 20 MG tablet Take 1 tablet (20 mg total) by mouth 2 (two) times daily. 180 tablet 0   Armodafinil (NUVIGIL) 150 MG tablet Take 1 tablet (150 mg total) by mouth daily. 90 tablet 0   buPROPion (WELLBUTRIN XL) 150 MG 24 hr tablet Take 2 tablets (300 mg total) by mouth daily. 60 tablet 0   cholecalciferol (VITAMIN D3) 25 MCG (1000 UNIT) tablet Take 4,000 Units by mouth daily.     citalopram (CELEXA) 40 MG tablet Take 1 tablet (40 mg total) by mouth daily. 90 tablet 3   levothyroxine (SYNTHROID, LEVOTHROID) 112 MCG tablet Take 112 mcg by mouth daily  before breakfast.      loratadine (CLARITIN) 10 MG tablet Take 10 mg by mouth daily as needed for allergies. OTC     propranolol (INDERAL) 10 MG tablet 2 (two) times daily as needed. Take 10 mg twice a day     No current facility-administered medications for this visit.    Medication Side Effects: None  Allergies:  Allergies  Allergen Reactions   Sulfa Antibiotics Rash   Butorphanol     Other reaction(s): Unknown    Other Other (See Comments)   Sulfamethoxazole-Trimethoprim     Other reaction(s): Unknown   Tetracycline     Other reaction(s): Unknown   Provigil [Modafinil] Palpitations    Low dose of 150 mg was tolerated , 250 mg was not.     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   TMJ (dislocation of temporomandibular joint)    Varicose veins    BOTH LEGS, worsening,painfulk, leg fatigue extreme at times   Vitamin D deficiency     Family History  Problem Relation Age of Onset   Hypothyroidism Mother    Hypertension Mother    Osteoarthritis Mother        bilateral knee replacements   High Cholesterol Mother    Obesity Mother    Hypertension Father    Hyperlipidemia Father    High Cholesterol Father    Obesity Father    Hypothyroidism Other    Acute lymphoblastic leukemia Son 3       in remission   Hypothyroidism Son     Social History   Socioeconomic History   Marital status: Married    Spouse name: Acupuncturist   Number of children: 2   Years of education: Assoc deg   Highest education level: Not on file  Occupational History   Occupation: Stay at home mom    Employer: UNEMPLOYED    Comment: RN degree  Tobacco Use   Smoking status: Never   Smokeless tobacco: Never  Substance and Sexual Activity   Alcohol use: No   Drug use: No   Sexual activity: Yes    Partners: Male  Other Topics Concern   Not on file  Social History Narrative   The patient is a Engineer, civil (consulting), RN NICU, now at home.  Caucasian female, married, has two children, two years of college and is not employed.    She denies any illegal drugs, alcohol , tobacco use,  consumes one cup of coffee in mornings some days.         Social Determinants of Health   Financial Resource Strain: Not on file  Food Insecurity: Not on file  Transportation Needs: Not on file  Physical Activity: Not on file  Stress: Not on file  Social Connections: Not on file  Intimate Partner Violence: Not on file    Past Medical History, Surgical history, Social history, and Family history were reviewed and updated as appropriate.   Please see review of systems for further details on the patient's review from today.  Objective:   Physical Exam:  There were no vitals taken for this visit.  Physical Exam Constitutional:      General: She is not in acute distress. Musculoskeletal:        General: No deformity.  Neurological:     Mental Status: She is alert and oriented to person, place, and time.     Coordination: Coordination normal.  Psychiatric:        Attention and Perception: Attention and perception normal. She does not perceive auditory or visual hallucinations.        Mood and Affect: Mood normal. Mood is not anxious or depressed. Affect is not labile, blunt, angry or inappropriate.        Speech: Speech normal.        Behavior: Behavior normal.        Thought Content: Thought content normal. Thought content is not paranoid or delusional. Thought content does not include homicidal or suicidal ideation. Thought content does not include homicidal or suicidal plan.        Cognition and Memory: Cognition and memory normal.        Judgment: Judgment normal.     Comments: Insight intact    Lab Review:     Component Value Date/Time   NA 139 06/15/2019 0832   K 4.6 06/15/2019 0832   CL 102 06/15/2019 0832   CO2 23 06/15/2019 0832   GLUCOSE 102 (H) 06/15/2019 0832   BUN 15 06/15/2019 0832   CREATININE 0.72 06/15/2019 0832   CALCIUM 9.5 06/15/2019 0832   PROT 7.1 06/28/2012 0927   ALBUMIN 4.4 06/28/2012 0927   AST 14 06/28/2012 0927   ALT 12 06/28/2012 0927    ALKPHOS 76 06/28/2012 0927   BILITOT 0.3 06/28/2012 0927   GFRNONAA 101 06/15/2019 0832   GFRAA 116 06/15/2019 0832       Component Value Date/Time   WBC 5.1 06/28/2012 0927   WBC 8.2 12/27/2008 0535   RBC 4.29 06/28/2012 0927   RBC 3.43 (L) 12/27/2008 0535   HGB 12.8 06/28/2012 0927   HCT 39.0 06/28/2012 0927   PLT 291 06/28/2012 0927   MCV 91 06/28/2012 0927   MCH 29.8 06/28/2012 0927   MCHC 32.8 06/28/2012 0927   MCHC 34.8 12/27/2008 0535   RDW 13.5 06/28/2012 0927    No results found for: POCLITH, LITHIUM   No results found for: PHENYTOIN, PHENOBARB, VALPROATE, CBMZ   .res Assessment: Plan:    Plan:  1. Celexa 40mg  daily 2. D/C Adderall 20mg  BID 3. Xanax 0.25mg  daily as needed 4. Recently started Nuvigil 150mg  daily 5. Wellbutrin XL 300mg  daily. Denies seizure history.    RTC 4 weeks  Patient advised to contact office with any questions, adverse effects, or acute worsening in signs and symptoms.  Discussed potential benefits, risks, and side effects of stimulants with patient to include increased heart rate, palpitations, insomnia, increased anxiety, increased irritability, or decreased appetite.  Instructed patient to contact office if experiencing any significant tolerability issues.    Diagnoses and all orders for this visit:  Major depressive disorder, recurrent episode, moderate (HCC)  Generalized anxiety disorder  Attention deficit hyperactivity disorder (ADHD), unspecified ADHD type   Please see After Visit Summary for patient specific instructions.  Future Appointments  Date Time Provider Department Center  11/14/2020  2:00 PM , MD [facility]  01/15/2021 11:20 AM Jaaziel Peatross, Enid Baas, NP [facility] None    No orders of the defined types were placed in this  encounter.     -------------------------------

## 2020-10-24 ENCOUNTER — Ambulatory Visit (HOSPITAL_COMMUNITY)
Admission: RE | Admit: 2020-10-24 | Discharge: 2020-10-24 | Disposition: A | Discharge status: Home / Self Care | Payer: 59 | Source: Ambulatory Visit | Attending: Internal Medicine | Admitting: Internal Medicine

## 2020-10-24 ENCOUNTER — Other Ambulatory Visit: Payer: Self-pay

## 2020-10-24 ENCOUNTER — Other Ambulatory Visit (HOSPITAL_COMMUNITY): Payer: Self-pay | Admitting: Internal Medicine

## 2020-10-24 DIAGNOSIS — M79662 Pain in left lower leg: Secondary | ICD-10-CM

## 2020-11-14 ENCOUNTER — Other Ambulatory Visit: Payer: Self-pay

## 2020-11-14 ENCOUNTER — Ambulatory Visit (INDEPENDENT_AMBULATORY_CARE_PROVIDER_SITE_OTHER): Payer: 59 | Admitting: Sports Medicine

## 2020-11-14 DIAGNOSIS — M722 Plantar fascial fibromatosis: Secondary | ICD-10-CM

## 2020-11-14 NOTE — Assessment & Plan Note (Signed)
Trial of ESWT  Location: RT foot plantar primary/ Lt foot plantar secondary  Rt foot progressed from  Power 60 to 90  Freq 10 Impulses ; 2000 Tolerated well  Lt foot Power level 90 Impulses 1000 Frequency 10  Tolerated well  Keep up HEP and stretches Keep up custom orthotics Consider weekly ESWT if helpful

## 2020-11-14 NOTE — Progress Notes (Signed)
PCP: Creola Corn, MD  Subjective:   HPI: Patient is a 50 y.o. female here for continued chronic bilateral heel pain.  Sports med visit 10/03/2020: Chief complaint chronic plantar fasciitis on the right and bilateral foot pain   Patient has been treated by podiatry She has had chronic plantar fascial pain symptoms On the right this is persisted for a year On the left this is been worse over the past 3 months For step in the morning or getting up out of the chair is very painful   She had 2 cortisone injections to her right foot but they only gave her 1 week of pain relief She had a rigid three-quarter length orthotic made but it was very uncomfortable   She has tried multiple types of shoes but none of them give her any prolonged relief   This limits her ability to work or stand on her feet for very long periods of time   She is considering getting PRP injections at podiatry but wanted a second opinion  11/14/20 - Orthotics helped for couple days, but the pain returned - Orthotics made difficult to fit in her normal shoes, she needed to buy different tennis shoes 1.5 sizes up and wide width (patient now wonders if she may have been wearing too small of shoe size) - Previously diagnosed with chronic right plantar fasciitis and left transverse arch collapse - Patient now feels like her left heel hurts just like the right plantar fasciitis - She disappointed that she still has this pain given she has tried some knee different options - She is open to trying the shockwave therapy today  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    TMJ (dislocation of temporomandibular joint)    Varicose veins    BOTH LEGS, worsening,painfulk, leg fatigue extreme at times   Vitamin D deficiency     Current Outpatient Medications on File Prior to Visit  Medication Sig Dispense Refill   albuterol (VENTOLIN HFA) 108 (90 Base) MCG/ACT inhaler Inhale 2 puffs into the lungs every 4 (four) hours as needed.     ALPRAZolam (XANAX) 0.25 MG tablet Take 1 tablet (0.25 mg total) by mouth at bedtime as needed for anxiety. 90 tablet 0   amphetamine-dextroamphetamine (ADDERALL) 20 MG tablet Take 1 tablet (20 mg total) by mouth 2 (two) times daily. 180 tablet 0   Armodafinil (NUVIGIL) 150 MG tablet Take 1 tablet (150 mg total) by mouth daily. 90 tablet 0   buPROPion (WELLBUTRIN XL) 150 MG 24 hr tablet Take 2 tablets (300 mg total) by mouth daily. 60 tablet 0   cholecalciferol (VITAMIN D3) 25 MCG (1000 UNIT) tablet Take 4,000 Units by mouth daily.     citalopram (CELEXA) 40 MG tablet Take 1 tablet (40 mg total) by mouth daily. 90 tablet 3   levothyroxine (SYNTHROID, LEVOTHROID) 112 MCG tablet Take 112 mcg by mouth daily before breakfast.      loratadine (CLARITIN) 10 MG tablet Take 10 mg by mouth daily as  needed for allergies. OTC     propranolol (INDERAL) 10 MG tablet 2 (two) times daily as needed. Take 10 mg twice a day     No current facility-administered medications on file prior to visit.    Past Surgical History:  Procedure Laterality Date   ABDOMINAL HYSTERECTOMY  2010   partial, s/p prolapse   CESAREAN SECTION  2002   KIDNEY STONE SURGERY     miscarriage  2000   tendonitis of right achilles  2011   TMJ ARTHROPLASTY  1990   TONSILLECTOMY  2004   VAGINAL PROLAPSE REPAIR  2010    Allergies  Allergen Reactions   Sulfa Antibiotics Rash   Butorphanol     Other reaction(s): Unknown   Other Other (See Comments)   Sulfamethoxazole-Trimethoprim     Other reaction(s): Unknown   Tetracycline     Other reaction(s): Unknown   Provigil  [Modafinil] Palpitations    Low dose of 150 mg was tolerated , 250 mg was not.     Social History   Socioeconomic History   Marital status: Married    Spouse name: Acupuncturist   Number of children: 2   Years of education: Assoc deg   Highest education level: Not on file  Occupational History   Occupation: Stay at home mom    Employer: UNEMPLOYED    Comment: RN degree  Tobacco Use   Smoking status: Never   Smokeless tobacco: Never  Substance and Sexual Activity   Alcohol use: No   Drug use: No   Sexual activity: Yes    Partners: Male  Other Topics Concern   Not on file  Social History Narrative   The patient is a Engineer, civil (consulting), RN NICU, now at home.  Caucasian female, married, has two children, two years of college and is not employed.    She denies any illegal drugs, alcohol , tobacco use, consumes one cup of coffee in mornings some days.         Social Determinants of Health   Financial Resource Strain: Not on file  Food Insecurity: Not on file  Transportation Needs: Not on file  Physical Activity: Not on file  Stress: Not on file  Social Connections: Not on file  Intimate Partner Violence: Not on file    Family History  Problem Relation Age of Onset   Hypothyroidism Mother    Hypertension Mother    Osteoarthritis Mother        bilateral knee replacements   High Cholesterol Mother    Obesity Mother    Hypertension Father    Hyperlipidemia Father    High Cholesterol Father    Obesity Father    Hypothyroidism Other    Acute lymphoblastic leukemia Son 3       in remission   Hypothyroidism Son     Ht 5\' 5"  (1.651 m)   Wt 220 lb (99.8 kg)   BMI 36.61 kg/m   No flowsheet data found.  No flowsheet data found.  Review of Systems: See HPI above.     Objective:  Physical Exam:  Gen: NAD, comfortable in exam room Inspection: Nonswollen grossly normal feet Palpation: Some TTP over medial plantar calcaneus near midfoot ROM: Full Strength: 5/5 and equal  bilaterally Stability: Ankle stable and without laxity, Achilles tendon intact bilaterally   Assessment & Plan:  1.  Chronic bilateral plantar fasciitis: Provided acoustic shockwave therapy today for the first time.  If patient finds some relief (20% or more) in the next  couple days, she will call us back to schedule further treatments.  Given chronicity of pain, may require 5 to 7 weeks of once to twice weekly treatment.   Fayette Pho, MD [facility]   I observed and examined the patient with the resident and agree with assessment and plan.  Note reviewed and modified by me. Sterling Big, MD

## 2020-11-21 ENCOUNTER — Other Ambulatory Visit: Payer: Self-pay | Admitting: Adult Health

## 2020-11-21 DIAGNOSIS — F909 Attention-deficit hyperactivity disorder, unspecified type: Secondary | ICD-10-CM

## 2020-11-21 MED ORDER — AMPHETAMINE-DEXTROAMPHETAMINE 20 MG PO TABS: 20.0000 mg | ORAL_TABLET | Freq: Two times a day (BID) | ORAL | 0 refills | Status: DC

## 2020-11-22 ENCOUNTER — Other Ambulatory Visit: Payer: Self-pay

## 2020-11-22 ENCOUNTER — Ambulatory Visit (INDEPENDENT_AMBULATORY_CARE_PROVIDER_SITE_OTHER): Payer: Self-pay | Admitting: Family Medicine

## 2020-11-22 DIAGNOSIS — M722 Plantar fascial fibromatosis: Secondary | ICD-10-CM

## 2020-11-22 NOTE — Progress Notes (Signed)
   Vanessa Blanchard is a 50 y.o. female who presents to [facility] today for the following:  Bilateral Plantar Fasciitis Presents for 2nd Shockwave therapy She has had some improvement from last treatment, noticed that she had pain at 3pm, when usually it occurs at 12pm Did not have any bruising   PMH reviewed.  ROS as above. Medications reviewed.  Exam:  There were no vitals taken for this visit. Gen: Well NAD MSK:  Bilateral Feet: Inspection:  No obvious bony deformity b/l.  No swelling, erythema, or bruising b/l Palpation: TTP medial aspect of  calcaneus at origin of plantar fascia   Assessment and Plan: 1) Bilateral plantar fasciitis Shockwave therapy performed again today.  We will have her follow-up for 1-2 more treatments as she desires if she continues to have improvement.  She is tolerating shockwave therapy well.  Procedure: ECSWT Indications:  Bilateral plantar fasciitis   Procedure Details Consent: Risks of procedure as well as the alternatives and risks of each were explained to the patient.  Written consent for procedure obtained. Time Out: Verified patient identification, verified procedure, site was marked, verified correct patient position, medications/allergies/relevent history reviewed.  The area was cleaned with alcohol swab.     The Right plantar fascia was targeted for Extracorporeal shockwave therapy.    Preset: plantar fasciitis Power Level: 90 Frequency: 10 Impulse/cycles: 2000 Head size: large  The Left plantar fascia was targeted for Extracorporeal shockwave therapy.    Preset: plantar fasciitis Power Level: 90 Frequency: 10 Impulse/cycles: 2000 Head size: large      Patient tolerated procedure well without immediate complications     Luis Abed, D.O.  PGY-4 [facility]  11/22/2020 1:53 PM

## 2020-11-22 NOTE — Assessment & Plan Note (Signed)
Shockwave therapy performed again today.  We will have her follow-up for 1-2 more treatments as she desires if she continues to have improvement.  She is tolerating shockwave therapy well.  Procedure: ECSWT Indications:  Bilateral plantar fasciitis   Procedure Details Consent: Risks of procedure as well as the alternatives and risks of each were explained to the patient.  Written consent for procedure obtained. Time Out: Verified patient identification, verified procedure, site was marked, verified correct patient position, medications/allergies/relevent history reviewed.  The area was cleaned with alcohol swab.     The Right plantar fascia was targeted for Extracorporeal shockwave therapy.    Preset: plantar fasciitis Power Level: 90 Frequency: 10 Impulse/cycles: 2000 Head size: large  The Left plantar fascia was targeted for Extracorporeal shockwave therapy.    Preset: plantar fasciitis Power Level: 90 Frequency: 10 Impulse/cycles: 2000 Head size: large      Patient tolerated procedure well without immediate complications

## 2020-11-27 ENCOUNTER — Other Ambulatory Visit: Payer: Self-pay

## 2020-11-27 ENCOUNTER — Ambulatory Visit (INDEPENDENT_AMBULATORY_CARE_PROVIDER_SITE_OTHER): Payer: Self-pay | Admitting: Family Medicine

## 2020-11-27 DIAGNOSIS — M722 Plantar fascial fibromatosis: Secondary | ICD-10-CM

## 2020-11-27 NOTE — Assessment & Plan Note (Addendum)
Doing well with shockwave therapy.  She has completed 3 shockwave therapies per today, see procedure note below.  She plans to follow up next week for her 4th and final therapy.  Procedure: ECSWT Indications:   Bilateral plantar fasciitis   Procedure Details Consent: Risks of procedure as well as the alternatives and risks of each were explained to the patient.  Written consent for procedure obtained. Time Out: Verified patient identification, verified procedure, site was marked, verified correct patient position, medications/allergies/relevent history reviewed.  The area was cleaned with alcohol swab.     The right plantar fascia was targeted for Extracorporeal shockwave therapy.    Preset:  Plantar fascia Power Level: 90 Frequency: 10 Impulse/cycles: 3000 Head size: large   Patient tolerated procedure well without immediate complications    The  left plantar fascia was targeted for Extracorporeal shockwave therapy.    Preset:  Plantar fascia Power Level: 90 Frequency: 10 Impulse/cycles: 3000 Head size: large   Patient tolerated procedure well without immediate complications

## 2020-11-27 NOTE — Progress Notes (Signed)
   Vanessa Blanchard is a 50 y.o. female who presents to [facility] today for the following:  Bilateral plantar fasciitis She presents today for her third shockwave therapy She reports improvement in her pain and was able [place]foot [place] the beach, which is new for her Overall she is very happy with her progress and plans to do 4 treatments   PMH reviewed.  ROS as above. Medications reviewed.  Exam:  There were no vitals taken for this visit. Gen: Well NAD MSK:  Bilateral feet: Inspection: No obvious bony deformities, swelling, erythema, bruising bilaterally Palpation: Mild tenderness palpation at the medial aspect of the calcaneus at origin of plantar fascia bilaterally  No results found.   Assessment and Plan: 1) Bilateral plantar fasciitis Doing well with shockwave therapy.  She has completed 3 shockwave therapies per today, see procedure note below.  She plans to follow up next week for her 4th and final therapy.  Procedure: ECSWT Indications:   Bilateral plantar fasciitis   Procedure Details Consent: Risks of procedure as well as the alternatives and risks of each were explained to the patient.  Written consent for procedure obtained. Time Out: Verified patient identification, verified procedure, site was marked, verified correct patient position, medications/allergies/relevent history reviewed.  The area was cleaned with alcohol swab.     The right plantar fascia was targeted for Extracorporeal shockwave therapy.    Preset:  Plantar fascia Power Level: 90 Frequency: 10 Impulse/cycles: 3000 Head size: large   Patient tolerated procedure well without immediate complications    The  left plantar fascia was targeted for Extracorporeal shockwave therapy.    Preset:  Plantar fascia Power Level: 90 Frequency: 10 Impulse/cycles: 3000 Head size: large   Patient tolerated procedure well without immediate complications     Luis Abed, D.O.  PGY-4 [facility]  11/27/2020 4:21 PM  Addendum:  Patient seen in the office by fellow.  Her history, exam, plan of care were precepted with me.  Norton Blizzard MD Marrianne Mood

## 2020-12-06 ENCOUNTER — Other Ambulatory Visit: Payer: Self-pay

## 2020-12-06 ENCOUNTER — Ambulatory Visit (INDEPENDENT_AMBULATORY_CARE_PROVIDER_SITE_OTHER): Payer: Self-pay | Admitting: Family Medicine

## 2020-12-06 VITALS — Ht 65.0 in

## 2020-12-06 DIAGNOSIS — M722 Plantar fascial fibromatosis: Secondary | ICD-10-CM

## 2020-12-06 NOTE — Assessment & Plan Note (Addendum)
Overall doing well, but did back slide this week.  Discussed trying a few more treatments vs holding off to closer to December when she is taking a trip.  She tolerated her shockwave therapy well today.  Procedure: ECSWT Indications:   Bilateral plantar fasciitis   Procedure Details Consent: Risks of procedure as well as the alternatives and risks of each were explained to the patient.  Written consent for procedure obtained. Time Out: Verified patient identification, verified procedure, site was marked, verified correct patient position, medications/allergies/relevent history reviewed.  The area was cleaned with alcohol swab.     The  right plantar fascia was targeted for Extracorporeal shockwave therapy.    Preset:  Plantar fascia Power Level:  90 Frequency: 10 Impulse/cycles: 3000 Head size:  Large   Patient tolerated procedure well without immediate complications    The  left plantar fascia was targeted for Extracorporeal shockwave therapy.    Preset:  Plantar fascia Power Level:  90 Frequency: 10 Impulse/cycles: 3000 Head size:  Large   Patient tolerated procedure well without immediate complications

## 2020-12-06 NOTE — Progress Notes (Signed)
   Vanessa Blanchard is a 50 y.o. female who presents to [facility] today for the following:  Bilateral plantar fasciitis Patient presents today for her fourth shockwave therapy She was doing well, but over the last week, did notice some increased pain Didn't change activity or shoes  PMH reviewed.  ROS as above. Medications reviewed.  Exam:  Ht 5\' 5"  (1.651 m)   BMI 36.61 kg/m  Gen: Well NAD MSK:  Bilateral feet Inspection: No obvious bony deformities, swelling, erythema, bruising bilaterally Palpation:no TTP at origin of plantar fascia at calcaneus b/l  No results found.   Assessment and Plan: 1) Bilateral plantar fasciitis Overall doing well, but did back slide this week.  Discussed trying a few more treatments vs holding off to closer to December when she is taking a trip.  She tolerated her shockwave therapy well today.  Procedure: ECSWT Indications:   Bilateral plantar fasciitis   Procedure Details Consent: Risks of procedure as well as the alternatives and risks of each were explained to the patient.  Written consent for procedure obtained. Time Out: Verified patient identification, verified procedure, site was marked, verified correct patient position, medications/allergies/relevent history reviewed.  The area was cleaned with alcohol swab.     The  right plantar fascia was targeted for Extracorporeal shockwave therapy.    Preset:  Plantar fascia Power Level:  90 Frequency: 10 Impulse/cycles: 3000 Head size:  Large   Patient tolerated procedure well without immediate complications    The  left plantar fascia was targeted for Extracorporeal shockwave therapy.    Preset:  Plantar fascia Power Level:  90 Frequency: 10 Impulse/cycles: 3000 Head size:  Large   Patient tolerated procedure well without immediate complications     January, D.O.  PGY-4 [facility]  12/06/2020 1:01 PM  Addendum:  Patient seen in the office by fellow.  Her  history, exam, plan of care were precepted with me.  12/08/2020 MD Norton Blizzard

## 2021-01-15 ENCOUNTER — Encounter: Payer: Self-pay | Admitting: Adult Health

## 2021-01-15 ENCOUNTER — Ambulatory Visit: Payer: 59 | Admitting: Adult Health

## 2021-01-15 ENCOUNTER — Other Ambulatory Visit: Payer: Self-pay

## 2021-01-15 DIAGNOSIS — F909 Attention-deficit hyperactivity disorder, unspecified type: Secondary | ICD-10-CM | POA: Diagnosis not present

## 2021-01-15 DIAGNOSIS — F331 Major depressive disorder, recurrent, moderate: Secondary | ICD-10-CM

## 2021-01-15 DIAGNOSIS — F411 Generalized anxiety disorder: Secondary | ICD-10-CM

## 2021-01-15 NOTE — Progress Notes (Signed)
Vanessa Blanchard [id number] [birth year] 50 y.o.  Subjective:   Patient ID:  Vanessa Blanchard is a 50 y.o. (DOB Apr 22, 1970) female.  Chief Complaint: No chief complaint on file.   HPI Vanessa Blanchard presents to the office today for follow-up of anxiety, depression, and ADHD.  Describes mood today as "ok". Pleasant. Tearful at times. Mood symptoms - denies depression, anxiety and irritability.  Stating "I'm doing alright". Feels like medications are working well for her. Family doing well. Has decided to work with a weight loss management group. Has had her first meeting and has a plan. Her goal is to lose 76 pounds - is hopeful about the new plan. Family doing well. Feels like current medications are helpful. Reports increased "sweating". Working with therapist - Financial controller. Stable interest and motivation. Taking medications as prescribed.  Energy levels stable. Active, does not have a regular exercise routine with physical limitations - plantar fascitis. Enjoys some usual interests and activities. Married. Lives with husband and daughter 65 - son in college. Spending time with family.  Appetite adequate. Weight gain 8 pounds  - 220 from 212 pounds.  Sleeps well most nights. Averages 8 to 9 hours. Focus and concentration stable with Adderall. Completing tasks. Managing aspects of household. 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  Denies SI or HI.  Denies AH or VH.   Review of Systems:  Review of Systems  Musculoskeletal:  Negative for gait problem.  Neurological:  Negative for tremors.  Psychiatric/Behavioral:         Please refer to HPI   Medications: I have reviewed the patient's current medications.  Current Outpatient Medications  Medication Sig Dispense Refill   albuterol (VENTOLIN HFA) 108 (90 Base) MCG/ACT inhaler Inhale 2 puffs into the lungs every 4 (four) hours as needed.     ALPRAZolam (XANAX) 0.25 MG tablet Take 1 tablet (0.25 mg total) by mouth at bedtime as needed for anxiety. 90  tablet 0   amphetamine-dextroamphetamine (ADDERALL) 20 MG tablet Take 1 tablet (20 mg total) by mouth 2 (two) times daily. 180 tablet 0   Armodafinil (NUVIGIL) 150 MG tablet Take 1 tablet (150 mg total) by mouth daily. 90 tablet 0   buPROPion (WELLBUTRIN XL) 150 MG 24 hr tablet Take 2 tablets (300 mg total) by mouth daily. 60 tablet 0   cholecalciferol (VITAMIN D3) 25 MCG (1000 UNIT) tablet Take 4,000 Units by mouth daily.     citalopram (CELEXA) 40 MG tablet Take 1 tablet (40 mg total) by mouth daily. 90 tablet 3   levothyroxine (SYNTHROID, LEVOTHROID) 112 MCG tablet Take 112 mcg by mouth daily before breakfast.      loratadine (CLARITIN) 10 MG tablet Take 10 mg by mouth daily as needed for allergies. OTC     propranolol (INDERAL) 10 MG tablet 2 (two) times daily as needed. Take 10 mg twice a day     No current facility-administered medications for this visit.    Medication Side Effects: None  Allergies:  Allergies  Allergen Reactions   Sulfa Antibiotics Rash   Butorphanol     Other reaction(s): Unknown   Other Other (See Comments)   Sulfamethoxazole-Trimethoprim     Other reaction(s): Unknown   Tetracycline     Other reaction(s): Unknown   Provigil [Modafinil] Palpitations    Low dose of 150 mg was tolerated , 250 mg was not.     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   Please see review of systems for further details on the patient's review from today.   Objective:   Physical Exam:  There were no vitals taken for this visit.  Physical Exam Constitutional:      General: She is not in acute distress. Musculoskeletal:        General: No deformity.  Neurological:     Mental Status: She is alert and oriented to person, place, and time.     Coordination: Coordination normal.  Psychiatric:        Attention and Perception: Attention and perception normal. She does not perceive auditory or visual hallucinations.        Mood and Affect: Mood normal. Mood is not anxious or depressed. Affect is not labile, blunt, angry or inappropriate.        Speech: Speech normal.        Behavior: Behavior normal.        Thought Content: Thought content normal. Thought content is not paranoid or delusional. Thought content does not include homicidal or suicidal ideation. Thought content does not include homicidal or suicidal plan.        Cognition and Memory: Cognition and memory normal.        Judgment: Judgment normal.     Comments: Insight intact    Lab Review:     Component Value Date/Time   NA 139 06/15/2019 0832   K 4.6 06/15/2019 0832   CL 102 06/15/2019 0832   CO2 23 06/15/2019 0832   GLUCOSE 102 (H) 06/15/2019 0832   BUN 15 06/15/2019 0832   CREATININE 0.72 06/15/2019 0832   CALCIUM 9.5 06/15/2019 0832   PROT 7.1 06/28/2012 0927   ALBUMIN 4.4 06/28/2012 0927   AST 14 06/28/2012 0927   ALT 12 06/28/2012 0927   ALKPHOS 76 06/28/2012 0927   BILITOT 0.3 06/28/2012 0927   GFRNONAA 101 06/15/2019 0832   GFRAA 116 06/15/2019 0832       Component Value Date/Time   WBC 5.1 06/28/2012 0927   WBC 8.2 12/27/2008 0535   RBC 4.29 06/28/2012 0927   RBC 3.43 (L) 12/27/2008 0535   HGB 12.8 06/28/2012 0927   HCT 39.0 06/28/2012 0927   PLT 291 06/28/2012 0927   MCV 91 06/28/2012 0927   MCH 29.8 06/28/2012 0927   MCHC 32.8 06/28/2012 0927    MCHC 34.8 12/27/2008 0535   RDW 13.5 06/28/2012 0927    No results found for: POCLITH, LITHIUM   No results found for: PHENYTOIN, PHENOBARB, VALPROATE, CBMZ   .res Assessment: Plan:    Plan:  Celexa 40mg  daily Adderall 20mg  BID Xanax 0.25mg  daily as needed Wellbutrin XL 150mg  daily - has not increased to 300mg . Denies seizure history.  RTC 4 weeks  Patient advised to contact office with any  questions, adverse effects, or acute worsening in signs and symptoms.  Discussed potential benefits, risks, and side effects of stimulants with patient to include increased heart rate, palpitations, insomnia, increased anxiety, increased irritability, or decreased appetite.  Instructed patient to contact office if experiencing any significant tolerability issues.  Diagnoses and all orders for this visit:  Attention deficit hyperactivity disorder (ADHD), unspecified ADHD type  Major depressive disorder, recurrent episode, moderate (HCC)  Generalized anxiety disorder    Please see After Visit Summary for patient specific instructions.  Future Appointments  Date Time Provider Department Center  06/23/2021 11:20 AM Adrina Armijo, Thereasa Solo, NP [facility] None    No orders of the defined types were placed in this encounter.   -------------------------------

## 2021-01-30 ENCOUNTER — Other Ambulatory Visit: Payer: Self-pay | Admitting: Adult Health

## 2021-01-30 DIAGNOSIS — F909 Attention-deficit hyperactivity disorder, unspecified type: Secondary | ICD-10-CM

## 2021-01-30 MED ORDER — AMPHETAMINE-DEXTROAMPHETAMINE 20 MG PO TABS: 20.0000 mg | ORAL_TABLET | Freq: Two times a day (BID) | ORAL | 0 refills | Status: DC

## 2021-04-16 ENCOUNTER — Other Ambulatory Visit: Payer: Self-pay | Admitting: Adult Health

## 2021-04-16 DIAGNOSIS — F331 Major depressive disorder, recurrent, moderate: Secondary | ICD-10-CM

## 2021-04-17 NOTE — Telephone Encounter (Signed)
LVM for patient to [facility]. Per Gina's last note patient is only taking 150 mg. Want to check to see if she needs a RF.

## 2021-04-21 ENCOUNTER — Other Ambulatory Visit: Payer: Self-pay | Admitting: Internal Medicine

## 2021-04-21 DIAGNOSIS — E785 Hyperlipidemia, unspecified: Secondary | ICD-10-CM

## 2021-04-23 ENCOUNTER — Encounter: Payer: Self-pay | Admitting: Family Medicine

## 2021-05-03 ENCOUNTER — Encounter (HOSPITAL_BASED_OUTPATIENT_CLINIC_OR_DEPARTMENT_OTHER): Payer: Self-pay

## 2021-05-16 ENCOUNTER — Other Ambulatory Visit: Payer: Self-pay

## 2021-05-16 ENCOUNTER — Ambulatory Visit
Admission: RE | Admit: 2021-05-16 | Discharge: 2021-05-16 | Disposition: A | Discharge status: Home / Self Care | Payer: No Typology Code available for payment source | Source: Ambulatory Visit | Attending: Internal Medicine | Admitting: Internal Medicine

## 2021-05-16 DIAGNOSIS — E785 Hyperlipidemia, unspecified: Secondary | ICD-10-CM

## 2021-06-23 ENCOUNTER — Ambulatory Visit (INDEPENDENT_AMBULATORY_CARE_PROVIDER_SITE_OTHER): Payer: 59 | Admitting: Adult Health

## 2021-06-23 ENCOUNTER — Encounter: Payer: Self-pay | Admitting: Adult Health

## 2021-06-23 DIAGNOSIS — F909 Attention-deficit hyperactivity disorder, unspecified type: Secondary | ICD-10-CM | POA: Diagnosis not present

## 2021-06-23 DIAGNOSIS — F331 Major depressive disorder, recurrent, moderate: Secondary | ICD-10-CM | POA: Diagnosis not present

## 2021-06-23 DIAGNOSIS — F411 Generalized anxiety disorder: Secondary | ICD-10-CM

## 2021-06-23 NOTE — Progress Notes (Signed)
Vanessa JakschKatie J Armistead ?[id number] ?May 14, 1970 ?51 y.o. ? ?Subjective:  ? ?Patient ID:  Vanessa JakschKatie J Belzer is a 51 y.o. (DOB May 14, 1970) female. ? ?Chief Complaint: No chief complaint on file. ? ? ?HPI ?Vanessa JakschKatie J Hogsett presents to the office today for follow-up of anxiety, depression, and ADHD. ? ?Describes mood today as "ok". Pleasant. Tearful at times. Mood symptoms - denies depression, anxiety and irritability. Stating "I'm doing ok". Feels like medications are working well for her. Family doing well. Feels like current medications are helpful. Working with therapist as needed Land- Cathy Showfety. Stable interest and motivation. Taking medications as prescribed.  ?Energy levels stable. Active, does not have a regular exercise routine with physical limitations - plantar fascitis. ?Enjoys some usual interests and activities. Married. Lives with husband and daughter [id number] - son in college. Spending time with family.  ?Appetite adequate. Weight loss - 35 pounds - 185 pounds - Mounjaro. ?Sleeps well most nights. Averages 8 to 9 hours. ?Focus and concentration stable with Adderall. Completing tasks. Managing aspects of household. 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  ?Denies SI or HI.  ?Denies AH or VH. ? ? ?Review of Systems:  ?Review of Systems  ?Musculoskeletal:  Negative for gait problem.  ?Neurological:  Negative for tremors.  ?Psychiatric/Behavioral:    ?     Please refer to HPI  ? ?Medications: I have reviewed the patient's current medications. ? ?Current Outpatient Medications  ?Medication Sig Dispense Refill  ? albuterol (VENTOLIN HFA) 108 (90 Base) MCG/ACT inhaler Inhale 2 puffs into the lungs every 4 (four) hours as needed.    ? ALPRAZolam (XANAX) 0.25 MG tablet Take 1 tablet (0.25 mg total) by mouth at bedtime as needed for anxiety. 90 tablet 0  ? amphetamine-dextroamphetamine (ADDERALL) 20 MG tablet Take 1 tablet (20 mg total) by mouth 2 (two) times daily. 180 tablet 0  ? Armodafinil (NUVIGIL) 150 MG tablet Take 1 tablet (150 mg total)  by mouth daily. 90 tablet 0  ? buPROPion (WELLBUTRIN XL) 150 MG 24 hr tablet TAKE 2 TABLETS BY MOUTH DAILY. 60 tablet 0  ? cholecalciferol (VITAMIN D3) 25 MCG (1000 UNIT) tablet Take 4,000 Units by mouth daily.    ? citalopram (CELEXA) 40 MG tablet Take 1 tablet (40 mg total) by mouth daily. 90 tablet 3  ? levothyroxine (SYNTHROID, LEVOTHROID) 112 MCG tablet Take 112 mcg by mouth daily before breakfast.     ? loratadine (CLARITIN) 10 MG tablet Take 10 mg by mouth daily as needed for allergies. OTC    ? propranolol (INDERAL) 10 MG tablet 2 (two) times daily as needed. Take 10 mg twice a day    ? ?No current facility-administered medications for this visit.  ? ? ?Medication Side Effects: None ? ?Allergies:  ?Allergies  ?Allergen Reactions  ? Sulfa Antibiotics Rash  ? Sulfamethoxazole-Trimethoprim   ?  Other reaction(s): Unknown ?Other reaction(s): Other, Unknown ?Other reaction(s): Unknown ?  ? Butorphanol   ?  Other reaction(s): Unknown ?Other reaction(s): Unknown  ? Other Other (See Comments)  ? Tetracycline   ?  Other reaction(s): Unknown ?Other reaction(s): Unknown  ? Provigil [Modafinil] Palpitations  ?  Low dose of 150 mg was tolerated , 250 mg was not.   ? ? ?Past Medical History:  ?Diagnosis Date  ? Acne   ? Allergic rhinitis   ? Anxiety   ? Depression   ? Depression   ? Erythema   ? nodosum, left lower leg  ? Female stress incontinence   ?  Granuloma annulare   ? Hirsutism   ? Hyperhidrosis   ? Hypothyroidism   ? Narcolepsy   ? suspected without cataplexy,hypersomnia, MSLT with 4 SREM  ? Nephrolithiasis   ? right  ? Obesity   ? Thyroglossal cyst 08/2002  ? TMJ (dislocation of temporomandibular joint)   ? Varicose veins   ? BOTH LEGS, worsening,painfulk, leg fatigue extreme at times  ? Vitamin D deficiency   ? ? ?Past Medical History, Surgical history, Social history, and Family history were reviewed and updated as appropriate.  ? ?Please see review of systems for further details on the patient's review from  today.  ? ?Objective:  ? ?Physical Exam:  ?There were no vitals taken for this visit. ? ?Physical Exam ?Constitutional:   ?   General: She is not in acute distress. ?Musculoskeletal:     ?   General: No deformity.  ?Neurological:  ?   Mental Status: She is alert and oriented to person, place, and time.  ?   Coordination: Coordination normal.  ?Psychiatric:     ?   Attention and Perception: Attention and perception normal. She does not perceive auditory or visual hallucinations.     ?   Mood and Affect: Mood normal. Mood is not anxious or depressed. Affect is not labile, blunt, angry or inappropriate.     ?   Speech: Speech normal.     ?   Behavior: Behavior normal.     ?   Thought Content: Thought content normal. Thought content is not paranoid or delusional. Thought content does not include homicidal or suicidal ideation. Thought content does not include homicidal or suicidal plan.     ?   Cognition and Memory: Cognition and memory normal.     ?   Judgment: Judgment normal.  ?   Comments: Insight intact  ? ? ?Lab Review:  ?   ?Component Value Date/Time  ? NA 139 06/15/2019 0832  ? K 4.6 06/15/2019 0832  ? CL 102 06/15/2019 0832  ? CO2 23 06/15/2019 0832  ? GLUCOSE 102 (H) 06/15/2019 4034  ? BUN 15 06/15/2019 0832  ? CREATININE 0.72 06/15/2019 0832  ? CALCIUM 9.5 06/15/2019 0832  ? PROT 7.1 06/28/2012 0927  ? ALBUMIN 4.4 06/28/2012 0927  ? AST 14 06/28/2012 0927  ? ALT 12 06/28/2012 0927  ? ALKPHOS 76 06/28/2012 0927  ? BILITOT 0.3 06/28/2012 0927  ? GFRNONAA 101 06/15/2019 0832  ? GFRAA 116 06/15/2019 0832  ? ? ?   ?Component Value Date/Time  ? WBC 5.1 06/28/2012 0927  ? WBC 8.2 12/27/2008 0535  ? RBC 4.29 06/28/2012 0927  ? RBC 3.43 (L) 12/27/2008 0535  ? HGB 12.8 06/28/2012 0927  ? HCT 39.0 06/28/2012 0927  ? PLT 291 06/28/2012 0927  ? MCV 91 06/28/2012 0927  ? MCH 29.8 06/28/2012 0927  ? MCHC 32.8 06/28/2012 0927  ? MCHC 34.8 12/27/2008 0535  ? RDW 13.5 06/28/2012 0927  ? ? ?No results found for: POCLITH,  LITHIUM  ? ?No results found for: PHENYTOIN, PHENOBARB, VALPROATE, CBMZ  ? ?.res ?Assessment: Plan:   ? ?Plan: ? ?Celexa 40mg  daily ?Adderall 20mg  BID ?Xanax 0.25mg  daily as needed ?Wellbutrin XL 150mg  daily - has not increased to 300mg . Denies seizure history. ? ?RTC 4 weeks ? ?Patient advised to contact office with any questions, adverse effects, or acute worsening in signs and symptoms. ? ?Discussed potential benefits, risks, and side effects of stimulants with patient to include increased heart rate, palpitations,  insomnia, increased anxiety, increased irritability, or decreased appetite.  Instructed patient to contact office if experiencing any significant tolerability issues. ? ?Diagnoses and all orders for this visit: ? ?Attention deficit hyperactivity disorder (ADHD), unspecified ADHD type ? ?Major depressive disorder, recurrent episode, moderate (HCC) ? ?Generalized anxiety disorder ? ?  ? ?Please see After Visit Summary for patient specific instructions. ? ?No future appointments. ? ?No orders of the defined types were placed in this encounter. ? ? ?------------------------------- ?

## 2021-09-01 ENCOUNTER — Other Ambulatory Visit: Payer: Self-pay | Admitting: Adult Health

## 2021-09-01 DIAGNOSIS — F331 Major depressive disorder, recurrent, moderate: Secondary | ICD-10-CM

## 2021-09-01 DIAGNOSIS — F411 Generalized anxiety disorder: Secondary | ICD-10-CM

## 2021-09-01 MED ORDER — CITALOPRAM HYDROBROMIDE 40 MG PO TABS: 40.0000 mg | ORAL_TABLET | Freq: Every day | ORAL | 3 refills | Status: DC

## 2021-12-23 ENCOUNTER — Ambulatory Visit: Payer: 59 | Admitting: Adult Health

## 2022-05-20 ENCOUNTER — Other Ambulatory Visit: Payer: Self-pay | Admitting: Adult Health

## 2022-05-20 DIAGNOSIS — F909 Attention-deficit hyperactivity disorder, unspecified type: Secondary | ICD-10-CM

## 2022-05-20 DIAGNOSIS — F331 Major depressive disorder, recurrent, moderate: Secondary | ICD-10-CM

## 2022-05-20 MED ORDER — AMPHETAMINE-DEXTROAMPHETAMINE 20 MG PO TABS: 20.0000 mg | ORAL_TABLET | Freq: Two times a day (BID) | ORAL | 0 refills | Status: DC

## 2022-05-20 MED ORDER — BUPROPION HCL ER (XL) 150 MG PO TB24: ORAL_TABLET | ORAL | 3 refills | Status: DC

## 2022-05-26 ENCOUNTER — Ambulatory Visit (INDEPENDENT_AMBULATORY_CARE_PROVIDER_SITE_OTHER): Payer: Self-pay | Admitting: Adult Health

## 2022-05-26 DIAGNOSIS — F489 Nonpsychotic mental disorder, unspecified: Secondary | ICD-10-CM

## 2022-05-26 NOTE — Progress Notes (Signed)
Patient no show appointment. ? ?

## 2022-06-10 ENCOUNTER — Other Ambulatory Visit: Payer: Self-pay

## 2022-06-10 DIAGNOSIS — F909 Attention-deficit hyperactivity disorder, unspecified type: Secondary | ICD-10-CM

## 2022-06-10 MED ORDER — AMPHETAMINE-DEXTROAMPHETAMINE 20 MG PO TABS: 20.0000 mg | ORAL_TABLET | Freq: Two times a day (BID) | ORAL | 0 refills | Status: DC

## 2022-07-13 ENCOUNTER — Other Ambulatory Visit (HOSPITAL_COMMUNITY): Payer: Self-pay

## 2022-07-13 ENCOUNTER — Encounter: Payer: Self-pay | Admitting: Adult Health

## 2022-07-13 ENCOUNTER — Ambulatory Visit: Payer: 59 | Admitting: Adult Health

## 2022-07-13 DIAGNOSIS — F331 Major depressive disorder, recurrent, moderate: Secondary | ICD-10-CM | POA: Diagnosis not present

## 2022-07-13 DIAGNOSIS — F909 Attention-deficit hyperactivity disorder, unspecified type: Secondary | ICD-10-CM

## 2022-07-13 DIAGNOSIS — F411 Generalized anxiety disorder: Secondary | ICD-10-CM | POA: Diagnosis not present

## 2022-07-13 MED ORDER — CITALOPRAM HYDROBROMIDE 40 MG PO TABS: 40.0000 mg | ORAL_TABLET | Freq: Every day | ORAL | 3 refills | Status: AC

## 2022-07-13 MED ORDER — BUPROPION HCL ER (XL) 150 MG PO TB24: ORAL_TABLET | ORAL | 3 refills | Status: AC

## 2022-07-13 MED ORDER — AMPHETAMINE-DEXTROAMPHETAMINE 20 MG PO TABS
20.0000 mg | ORAL_TABLET | Freq: Two times a day (BID) | ORAL | 0 refills | Status: AC
  Filled 2022-07-13: qty 180, 90d supply, fill #0

## 2022-07-13 NOTE — Progress Notes (Addendum)
Vanessa Blanchard [id number] [birth year] 52 y.o.  Subjective:   Patient ID:  Vanessa Blanchard is a 52 y.o. (DOB 14-Sep-1970) female.  Chief Complaint: No chief complaint on file.   HPI Vanessa Blanchard presents to the office today for follow-up of anxiety, depression, and ADHD.  Describes mood today as "ok". Pleasant. Tearful at times. Mood symptoms - denies depression, anxiety and irritability. Mood is consistent. Stating "I'm doing good". Feels like current medications work well. Family doing well. Working with therapist as needed Land. Stable interest and motivation. Taking medications as prescribed.  Energy levels stable. Active, does not have a regular exercise routine. Enjoys some usual interests and activities. Married. Lives with husband and daughter 60 - son (54) in college. Spending time with family.  Appetite adequate. Weight loss - 150 pounds - Mounjaro. Sleeps well most nights. Averages 8 to 9 hours. Focus and concentration stable with Adderall. Completing tasks. Managing aspects of household. Works as an Charity fundraiser.  Denies SI or HI.  Denies AH or VH. Denies self harm.  Denies substance use.    Review of Systems:  Review of Systems  Musculoskeletal:  Negative for gait problem.  Neurological:  Negative for tremors.  Psychiatric/Behavioral:         Please refer to HPI    Medications: I have reviewed the patient's current medications.  Current Outpatient Medications  Medication Sig Dispense Refill   albuterol (VENTOLIN HFA) 108 (90 Base) MCG/ACT inhaler Inhale 2 puffs into the lungs every 4 (four) hours as needed.     ALPRAZolam (XANAX) 0.25 MG tablet Take 1 tablet (0.25 mg total) by mouth at bedtime as needed for anxiety. 90 tablet 0   amphetamine-dextroamphetamine (ADDERALL) 20 MG tablet Take 1 tablet (20 mg total) by mouth 2 (two) times daily. 60 tablet 0   Armodafinil (NUVIGIL) 150 MG tablet Take 1 tablet (150 mg total) by mouth daily. 90 tablet 0   buPROPion  (WELLBUTRIN XL) 150 MG 24 hr tablet Take two tablets every morning. 180 tablet 3   cholecalciferol (VITAMIN D3) 25 MCG (1000 UNIT) tablet Take 4,000 Units by mouth daily.     citalopram (CELEXA) 40 MG tablet Take 1 tablet (40 mg total) by mouth daily. 90 tablet 3   levothyroxine (SYNTHROID, LEVOTHROID) 112 MCG tablet Take 112 mcg by mouth daily before breakfast.      loratadine (CLARITIN) 10 MG tablet Take 10 mg by mouth daily as needed for allergies. OTC     propranolol (INDERAL) 10 MG tablet 2 (two) times daily as needed. Take 10 mg twice a day     No current facility-administered medications for this visit.    Medication Side Effects: None  Allergies:  Allergies  Allergen Reactions   Sulfa Antibiotics Rash   Sulfamethoxazole-Trimethoprim     Other reaction(s): Unknown Other reaction(s): Other, Unknown Other reaction(s): Unknown    Butorphanol     Other reaction(s): Unknown Other reaction(s): Unknown   Other Other (See Comments)   Tetracycline     Other reaction(s): Unknown Other reaction(s): Unknown   Provigil [Modafinil] Palpitations    Low dose of 150 mg was tolerated , 250 mg was not.     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   Please see review of systems for further details on the patient's review from today.   Objective:   Physical Exam:   There were no vitals taken for this visit.  Physical Exam Constitutional:      General: She is not in acute distress. Musculoskeletal:        General: No deformity.  Neurological:     Mental Status: She is alert and oriented to person, place, and time.     Coordination: Coordination normal.  Psychiatric:        Attention and Perception: Attention and perception normal. She does not perceive auditory or visual hallucinations.        Mood and Affect: Mood normal. Mood is not anxious or depressed. Affect is not labile, blunt, angry or inappropriate.        Speech: Speech normal.        Behavior: Behavior normal.        Thought Content: Thought content normal. Thought content is not paranoid or delusional. Thought content does not include homicidal or suicidal ideation. Thought content does not include homicidal or suicidal plan.        Cognition and Memory: Cognition and memory normal.        Judgment: Judgment normal.     Comments: Insight intact     Lab Review:     Component Value Date/Time   NA 139 06/15/2019 0832   K 4.6 06/15/2019 0832   CL 102 06/15/2019 0832   CO2 23 06/15/2019 0832   GLUCOSE 102 (H) 06/15/2019 0832   BUN 15 06/15/2019 0832   CREATININE 0.72 06/15/2019 0832   CALCIUM 9.5 06/15/2019 0832   PROT 7.1 06/28/2012 0927   ALBUMIN 4.4 06/28/2012 0927   AST 14 06/28/2012 0927   ALT 12 06/28/2012 0927   ALKPHOS 76 06/28/2012 0927   BILITOT 0.3 06/28/2012 0927   GFRNONAA 101 06/15/2019 0832   GFRAA 116 06/15/2019 0832       Component Value Date/Time   WBC 5.1 06/28/2012 0927   WBC 8.2 12/27/2008 0535   RBC 4.29 06/28/2012 0927   RBC 3.43 (L) 12/27/2008 0535   HGB 12.8 06/28/2012 0927   HCT 39.0 06/28/2012 0927   PLT 291 06/28/2012 0927   MCV 91 06/28/2012 0927   MCH 29.8 06/28/2012 0927   MCHC 32.8 06/28/2012 0927   MCHC 34.8 12/27/2008 0535   RDW 13.5 06/28/2012 0927    No results found for: "POCLITH", "LITHIUM"   No results found for:  "PHENYTOIN", "PHENOBARB", "VALPROATE", "CBMZ"   .res Assessment: Plan:    Plan:  Celexa  daily Wellbutrin XL  daily - has not increased to . Denies seizure history. Adderall  BID  RTC 6 months  Patient advised to contact office with any questions, adverse effects, or acute worsening in signs and symptoms.  Discussed potential benefits, risks, and side effects of stimulants with patient to include increased heart rate, palpitations, insomnia, increased anxiety, increased irritability, or decreased appetite.  Instructed patient to contact office if experiencing any significant tolerability issues.  There are no diagnoses linked to this encounter.   Please see After Visit Summary for patient specific instructions.  Future Appointments  Date Time Provider Department Center  07/13/2022  8:00 AM Candi Profit, Thereasa Solo, NP CP-CP None    No orders of the defined types were placed in this encounter.   -------------------------------

## 2022-07-15 ENCOUNTER — Other Ambulatory Visit (HOSPITAL_COMMUNITY): Payer: Self-pay

## 2023-01-12 ENCOUNTER — Ambulatory Visit: Payer: 59 | Admitting: Adult Health

## 2023-02-02 ENCOUNTER — Other Ambulatory Visit (HOSPITAL_COMMUNITY): Payer: Self-pay

## 2023-02-02 ENCOUNTER — Other Ambulatory Visit: Payer: Self-pay

## 2023-02-02 MED ORDER — AMPHETAMINE-DEXTROAMPHETAMINE 20 MG PO TABS
20.0000 mg | ORAL_TABLET | Freq: Two times a day (BID) | ORAL | 0 refills | Status: DC
  Filled 2023-02-02: qty 180, 90d supply, fill #0

## 2023-09-01 ENCOUNTER — Encounter (INDEPENDENT_AMBULATORY_CARE_PROVIDER_SITE_OTHER): Payer: Self-pay

## 2023-11-18 ENCOUNTER — Ambulatory Visit (INDEPENDENT_AMBULATORY_CARE_PROVIDER_SITE_OTHER): Admitting: Otolaryngology

## 2023-11-18 ENCOUNTER — Encounter (INDEPENDENT_AMBULATORY_CARE_PROVIDER_SITE_OTHER): Payer: Self-pay | Admitting: Otolaryngology

## 2023-11-18 VITALS — BP 121/75 | HR 77

## 2023-11-18 DIAGNOSIS — R040 Epistaxis: Secondary | ICD-10-CM

## 2023-11-18 NOTE — Progress Notes (Unsigned)
 CC: Recurrent epistaxis  HPI:  Vanessa Blanchard is a 53 y.o. female who presents today complaining of recurrent epistaxis for 10+ years.  The frequency and severity of her bleeding has increased over the past year.  The bleeding is mostly on the left side.  She denies any recent nasal trauma.  She is not on any blood thinner.  She also denies any family history of clotting disorder.  She has no previous nasal surgery.  She underwent adenotonsillectomy and thyroglossal duct cyst excision many years ago.  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   TMJ (dislocation of temporomandibular joint)    Varicose veins    BOTH LEGS, worsening,painfulk, leg fatigue extreme at times   Vitamin D deficiency     Past Surgical History:  Procedure Laterality Date   ABDOMINAL HYSTERECTOMY  2010   partial, s/p prolapse   CESAREAN SECTION  2002   KIDNEY STONE SURGERY     miscarriage  2000   tendonitis of right achilles  2011   TMJ ARTHROPLASTY  1990   TONSILLECTOMY  2004   VAGINAL PROLAPSE REPAIR  2010    Family History  Problem Relation Age of Onset   Hypothyroidism Mother    Hypertension Mother    Osteoarthritis Mother        bilateral knee replacements   High Cholesterol Mother    Obesity Mother    Hypertension Father    Hyperlipidemia Father    High Cholesterol Father    Obesity Father    Hypothyroidism Other    Acute lymphoblastic leukemia Son 3       in remission   Hypothyroidism Son     Social History:  reports that she has never smoked. She has never used smokeless tobacco. She reports that she does not drink alcohol and does not use  drugs.  Allergies:  Allergies  Allergen Reactions   Sulfa  Antibiotics Rash   Sulfamethoxazole -Trimethoprim      Other reaction(s): Unknown Other reaction(s): Other, Unknown Other reaction(s): Unknown    Butorphanol     Other reaction(s): Unknown Other reaction(s): Unknown   Other Other (See Comments)   Tetracycline     Other reaction(s): Unknown Other reaction(s): Unknown   Provigil [Modafinil] Palpitations    Low dose of 150 mg was tolerated , 250 mg was not.     Prior to Admission medications   Medication Sig Start Date End Date Taking? Authorizing Provider  amphetamine -dextroamphetamine  (ADDERALL) 20 MG tablet Take 1 tablet (20 mg) by mouth 2 times daily. 07/13/22   Mozingo, Regina Nattalie, NP  amphetamine -dextroamphetamine  (ADDERALL) 20 MG tablet Take 1 tablet (20 mg total) by mouth 2 (two) times daily as directed. 02/02/23     buPROPion  (WELLBUTRIN  XL) 150 MG 24 hr tablet Take two tablets every morning. 07/13/22   Mozingo, Regina Nattalie, NP  cholecalciferol (VITAMIN D3) 25 MCG (1000 UNIT) tablet Take 4,000 Units by mouth daily.    [provider]  citalopram  (CELEXA ) 40 MG tablet Take 1 tablet (40 mg  total) by mouth daily. 07/13/22   Mozingo, Regina Nattalie, NP  levothyroxine (SYNTHROID, LEVOTHROID) 112 MCG tablet Take 112 mcg by mouth daily before breakfast.  04/03/15   [provider]    Blood pressure 121/75, pulse 77, SpO2 98%. Exam: General: Communicates without difficulty, well nourished, no acute distress. Head: Normocephalic, no evidence injury, no tenderness, facial buttresses intact without stepoff. Face/sinus: No tenderness to palpation and percussion. Facial movement is normal and symmetric. Eyes: PERRL, EOMI. No scleral icterus, conjunctivae clear. Neuro: CN II exam reveals vision grossly intact.  No nystagmus at any point of gaze. Ears: Auricles well formed without lesions.  Ear canals are intact without mass or lesion.  No erythema or edema is  appreciated.  The TMs are intact without fluid. Nose: External evaluation reveals normal support and skin without lesions.  Dorsum is intact.  Anterior rhinoscopy reveals hypervascular areas on her nasal septum bilaterally, worse on the left side.  Oral:  Oral cavity and oropharynx are intact, symmetric, without erythema or edema.  Mucosa is moist without lesions. Neck: Full range of motion without pain.  There is no significant lymphadenopathy.  No masses palpable.  Thyroid  bed within normal limits to palpation.  Parotid glands and submandibular glands equal bilaterally without mass.  Trachea is midline. Neuro:  CN 2-12 grossly intact.   Procedure:  Endoscopic control of recurrent left epistaxis. Indication:  Recurrent epistaxis  Description:  The left nasal cavity is sprayed with topical xylocaine and neo-synephrine.  After adequate anesthesia is achieved, the nasal cavity is examined with a 0 rigid endoscope.  Several hypervascular areas are noted on the anterior and superior portion of the septum. A silver nitrate stick is inserted in parallel with the 0 endoscope.  It is used to repeatedly cauterized the hypervascular areas.  Good hemostasis is achieved.  The patient tolerated the procedure well.    Assessment: 1.  Bilateral recurrent epistaxis, worse on the left side. 2.  Multiple hypervascular areas are noted on the left anterior and superior nasal septum. 3.  No suspicious mass or lesion is noted today.  Plan: 1.  The physical exam and nasal endoscopy findings are reviewed with the patient. 2.  Cauterization of the left nasal septum. 3.  Nasal ointment and humidifier as needed. 4.  The patient will return for reevaluation in 1 month.  Rogen Porte W Khristi Schiller 11/18/2023, 12:54 PM

## 2023-11-19 ENCOUNTER — Telehealth (INDEPENDENT_AMBULATORY_CARE_PROVIDER_SITE_OTHER): Payer: Self-pay

## 2023-11-19 DIAGNOSIS — R040 Epistaxis: Secondary | ICD-10-CM | POA: Insufficient documentation

## 2023-11-19 NOTE — Telephone Encounter (Signed)
 Patient left a message stating that Dr Karis can cauterized a couple of places in her nose.  She said she sneezed earlier and a piece of the silver nitrate came out, she didn't have any bleeding.  She just wanted to make him aware of this and wanted to know if she needed to come next week to have the silver nitrate used again on that area.  Please call her and advise as to next steps.

## 2023-12-15 ENCOUNTER — Telehealth (INDEPENDENT_AMBULATORY_CARE_PROVIDER_SITE_OTHER): Payer: Self-pay | Admitting: Otolaryngology

## 2023-12-15 NOTE — Telephone Encounter (Signed)
 Left a voice mail regarding patient f/u appointment . If patient has not had any more nosebleed , she can cancel her f/u appointment for next week.

## 2023-12-20 ENCOUNTER — Ambulatory Visit (INDEPENDENT_AMBULATORY_CARE_PROVIDER_SITE_OTHER): Admitting: Otolaryngology

## 2024-02-25 ENCOUNTER — Ambulatory Visit
Admission: RE | Admit: 2024-02-25 | Discharge: 2024-02-25 | Disposition: A | Discharge status: Home / Self Care | Source: Ambulatory Visit | Attending: Internal Medicine | Admitting: Internal Medicine

## 2024-02-25 ENCOUNTER — Other Ambulatory Visit: Payer: Self-pay | Admitting: Internal Medicine

## 2024-02-25 DIAGNOSIS — R319 Hematuria, unspecified: Secondary | ICD-10-CM

## 2024-03-01 ENCOUNTER — Other Ambulatory Visit: Payer: Self-pay | Admitting: Urology

## 2024-03-02 NOTE — Progress Notes (Signed)
 Sent message, via epic in basket, requesting orders in epic from Careers adviser.

## 2024-03-03 ENCOUNTER — Other Ambulatory Visit: Payer: Self-pay | Admitting: Urology

## 2024-03-03 DIAGNOSIS — R310 Gross hematuria: Secondary | ICD-10-CM

## 2024-03-07 NOTE — Patient Instructions (Signed)
 SURGICAL WAITING ROOM VISITATION  Patients having surgery or a procedure may have no more than 2 support people in the waiting area - these visitors may rotate.    Children ages 53 and under will not be able to visit patients in [facility] under most circumstances.   Visitors with respiratory illnesses are discouraged from visiting and should remain at home.  If the patient needs to stay at the hospital during part of their recovery, the visitor guidelines for inpatient rooms apply. Pre-op nurse will coordinate an appropriate time for 1 support person to accompany patient in pre-op.  This support person may not rotate.    Please refer to the [facility] website for the visitor guidelines for Inpatients (after your surgery is over and you are in a regular room).       Your procedure is scheduled on: 03/13/24   Report to [facility] Main Entrance    Report to admitting at 6:15 AM   Call this number if you have problems the morning of surgery [phone]   Do not eat food or drink liquids :After Midnight. But may have sips of water to take meds.     Oral Hygiene is also important to reduce your risk of infection.                                    Remember - BRUSH YOUR TEETH THE MORNING OF SURGERY WITH YOUR REGULAR TOOTHPASTE   Stop all vitamins and herbal supplements 7 days before surgery.   Take these medicines the morning of surgery with A SIP OF WATER: bupropion , citalopram , celexa , synthroid, ondansetron, oxycodone, tamsulosin.            You may not have any metal on your body including hair pins, jewelry, and body piercing             Do not wear make-up, lotions, powders, perfumes/cologne, or deodorant  Do not wear nail polish including gel and S&S, artificial/acrylic nails, or any other type of covering on natural nails including finger and toenails. If you have artificial nails, gel coating, etc. that needs to be removed by a nail salon please have this  removed prior to surgery or surgery may need to be canceled/ delayed if the surgeon/ anesthesia feels like they are unable to be safely monitored.   Do not shave  48 hours prior to surgery.          Do not bring valuables to the hospital. [facility] IS NOT             RESPONSIBLE   FOR VALUABLES.   Contacts, glasses, dentures or bridgework may not be worn into surgery.    DO NOT BRING YOUR HOME MEDICATIONS TO THE HOSPITAL. PHARMACY WILL DISPENSE MEDICATIONS LISTED ON YOUR MEDICATION LIST TO YOU DURING YOUR ADMISSION IN THE HOSPITAL!    Patients discharged on the day of surgery will not be allowed to drive home.  Someone NEEDS to stay with you for the first 24 hours after anesthesia.   Special Instructions: Bring a copy of your healthcare power of attorney and living will documents the day of surgery if you haven't scanned them before.              Please read over the following fact sheets you were given: IF YOU HAVE QUESTIONS ABOUT YOUR PRE-OP INSTRUCTIONS PLEASE CALL [phone] [place]  If you received a COVID test during your pre-op visit  it is requested that you wear a mask when out in public, stay away from anyone that may not be feeling well and notify your surgeon if you develop symptoms. If you test positive for Covid or have been in contact with anyone that has tested positive in the last 10 days please notify you surgeon.    [place] - Preparing for Surgery Before surgery, you can play an important role.  Because skin is not sterile, your skin needs to be as free of germs as possible.  You can reduce the number of germs on your skin by washing with CHG (chlorahexidine gluconate) soap before surgery.  CHG is an antiseptic cleaner which kills germs and bonds with the skin to continue killing germs even after washing. Please DO NOT use if you have an allergy to CHG or antibacterial soaps.  If your skin becomes reddened/irritated stop using the CHG and inform your nurse when you  arrive at Short Stay. Do not shave (including legs and underarms) for at least 48 hours prior to the first CHG shower.  You may shave your face/neck.  Please follow these instructions carefully:  1.  Shower with CHG Soap the night before surgery and the morning of surgery.  2.  If you choose to wash your hair, wash your hair first as usual with your normal  shampoo.  3.  After you shampoo, rinse your hair and body thoroughly to remove the shampoo.                             4.  Use CHG as you would any other liquid soap.  You can apply chg directly to the skin and wash.  Gently with a scrungie or clean washcloth.  5.  Apply the CHG Soap to your body ONLY FROM THE NECK DOWN.   Do   not use on face/ open                           Wound or open sores. Avoid contact with eyes, ears mouth and   genitals (private parts).                       Wash face,  Genitals (private parts) with your normal soap.             6.  Wash thoroughly, paying special attention to the area where your    surgery  will be performed.  7.  Thoroughly rinse your body with warm water from the neck down.  8.  DO NOT shower/wash with your normal soap after using and rinsing off the CHG Soap.                9.  Pat yourself dry with a clean towel.            10.  Wear clean pajamas.            11.  Place clean sheets on your bed the night of your first shower and do not  sleep with pets. Day of Surgery : Do not apply any CHG, lotions/deodorants the morning of surgery.  Please wear clean clothes to the hospital/surgery center.  FAILURE TO FOLLOW THESE INSTRUCTIONS MAY RESULT IN THE CANCELLATION OF YOUR SURGERY  PATIENT SIGNATURE_________________________________  NURSE SIGNATURE__________________________________  ________________________________________________________________________

## 2024-03-07 NOTE — Progress Notes (Signed)
 COVID Vaccine received:  []  No [x]  Yes Date of any COVID positive Test in last 90 days: no PCP - Norleen Jungling MD Cardiologist - n/a  Chest x-ray -  EKG -   Stress Test -  ECHO - 08/05/18 Epic Cardiac Cath -   Bowel Prep - [x]  No  []   Yes ______  Pacemaker / ICD device [x]  No []  Yes   Spinal Cord Stimulator:[x]  No []  Yes       History of Sleep Apnea? [x]  No []  Yes   CPAP used?- [x]  No []  Yes    Does the patient monitor blood sugar?          [x]  No []  Yes  []  N/A  Patient has: [x]  NO Hx DM   []  Pre-DM                 []  DM1  []   DM2 Does patient have a [employer] or Dexacom? [x]  No []  Yes   Fasting Blood Sugar Ranges-  Checks Blood Sugar _____ times a day  GLP1 agonist / usual dose - Tirzepitide- last dose 03/02/24 and pt. Will hold until after surgery. GLP1 instructions:  SGLT-2 inhibitors / usual dose - no SGLT-2 instructions:   Blood Thinner / Instructions:no Aspirin Instructions:no  Comments:   Activity level: Patient is able  to climb a flight of stairs without difficulty; [x]  No CP  [x]  No SOB,    Patient can perform ADLs without assistance.   Anesthesia review: Difficulty swallowing,Trouble with mixed solid ie. Watermelon, thyroglossal duct cyst removed 2004.  Patient denies shortness of breath, fever, cough and chest pain at PAT appointment.  Patient verbalized understanding and agreement to the Pre-Surgical Instructions that were given to them at this PAT appointment. Patient was also educated of the need to review these PAT instructions again prior to his/her surgery.I reviewed the appropriate phone numbers to call if they have any and questions or concerns.

## 2024-03-08 ENCOUNTER — Inpatient Hospital Stay: Admission: RE | Admit: 2024-03-08 | Discharge: 2024-03-08 | Attending: Urology

## 2024-03-08 DIAGNOSIS — R310 Gross hematuria: Secondary | ICD-10-CM

## 2024-03-08 MED ORDER — IOHEXOL 300 MG/ML  SOLN
125.0000 mL | Freq: Once | INTRAMUSCULAR | Status: AC | PRN
  Administered 2024-03-08: 15:00:00 125 mL via INTRAVENOUS

## 2024-03-09 ENCOUNTER — Encounter (HOSPITAL_COMMUNITY): Payer: Self-pay

## 2024-03-09 ENCOUNTER — Other Ambulatory Visit: Payer: Self-pay

## 2024-03-09 ENCOUNTER — Encounter (HOSPITAL_COMMUNITY)
Admission: RE | Admit: 2024-03-09 | Discharge: 2024-03-09 | Disposition: A | Discharge status: Home / Self Care | Source: Ambulatory Visit | Attending: Urology | Admitting: Urology

## 2024-03-09 VITALS — BP 122/60 | HR 95 | Temp 98.1°F | Resp 16 | Ht 65.0 in | Wt 145.0 lb

## 2024-03-09 DIAGNOSIS — Z01818 Encounter for other preprocedural examination: Secondary | ICD-10-CM

## 2024-03-09 DIAGNOSIS — Z01812 Encounter for preprocedural laboratory examination: Secondary | ICD-10-CM | POA: Diagnosis present

## 2024-03-09 LAB — CBC
HCT: 37.1 % (ref 36.0–46.0)
Hemoglobin: 12.3 g/dL (ref 12.0–15.0)
MCH: 29.9 pg (ref 26.0–34.0)
MCHC: 33.2 g/dL (ref 30.0–36.0)
MCV: 90.3 fL (ref 80.0–100.0)
Platelets: 236 K/uL (ref 150–400)
RBC: 4.11 MIL/uL (ref 3.87–5.11)
RDW: 12.2 % (ref 11.5–15.5)
WBC: 3.4 K/uL — ABNORMAL LOW (ref 4.0–10.5)
nRBC: 0 % (ref 0.0–0.2)

## 2024-03-10 NOTE — H&P (Addendum)
 Urology Preoperative H&P   Chief Complaint: Left UPJ stone  History of Present Illness: Vanessa Blanchard is a 53 y.o. female with Left UPJ stone here for cysto, L RPG, L URS/LL, L stent placement. Denies fevers, chills, dysuria. Preop Ucx contaminant with staph epidermidis, covered last week with antibiotics.    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,hypersomnia, MSLT with 4 SREM   Nephrolithiasis    right   Obesity    Thyroglossal cyst 08/22/2002   TMJ (dislocation of temporomandibular joint)    Varicose veins    BOTH LEGS, worsening,painfulk, leg fatigue extreme at times   Vitamin D deficiency     Past Surgical History:  Procedure Laterality Date   ABDOMINAL HYSTERECTOMY  03/23/2008   partial, s/p prolapse   CESAREAN SECTION  03/23/2000   KIDNEY STONE SURGERY     miscarriage  03/23/1998   tendonitis of right achilles  03/23/2009   TMJ ARTHROPLASTY  03/23/1988   TONSILLECTOMY  03/23/2002   also thyroglossal duct cyst  removal   VAGINAL PROLAPSE REPAIR  03/23/2008    Allergies: Allergies[1]  Family History  Problem Relation Age of Onset   Hypothyroidism Mother    Hypertension Mother    Osteoarthritis Mother        bilateral knee replacements   High Cholesterol Mother    Obesity Mother    Hypertension Father    Hyperlipidemia Father    High Cholesterol Father    Obesity Father    Hypothyroidism Other    Acute lymphoblastic leukemia Son 3       in remission   Hypothyroidism Son     Social History:  reports that she has never smoked. She has never used smokeless tobacco. She reports that she does not  drink alcohol and does not use drugs.  ROS: A complete review of systems was performed.  All systems are negative except for pertinent findings as noted.  Physical Exam:  Vital signs in last 24 hours: Temp:  [98 F (36.7 C)] 98 F (36.7 C) (12/22 0635) Pulse Rate:  [65] 65 (12/22 0635) Resp:  [16] 16 (12/22 0635) BP: (112)/(54) 112/54 (12/22 0635) SpO2:  [100 %] 100 % (12/22 9364) Constitutional:  Alert and oriented, No acute distress Cardiovascular: Regular rate and rhythm Respiratory: Normal respiratory effort, Lungs clear bilaterally GI: Abdomen is soft, nontender, nondistended, no abdominal masses GU: No CVA tenderness Lymphatic: No lymphadenopathy Neurologic: Grossly intact, no focal deficits Psychiatric: Normal mood and affect  Laboratory Data:  No results for input(s): WBC, HGB, HCT, PLT in the last 72 hours.  No results for input(s): NA, K, CL, GLUCOSE, BUN, CALCIUM, CREATININE in the last 72 hours.  Invalid input(s): CO3   No results found for this or any previous visit (from the past 24 hours). No results found for this or any previous visit (from the past 240 hours).  Renal Function: No results for input(s): CREATININE in the last 168 hours. CrCl cannot be calculated (Patient's most  recent lab result is older than the maximum 21 days allowed.).  Radiologic Imaging: No results found.  I independently reviewed the above imaging studies.  Assessment and Plan Vanessa Blanchard is a 53 y.o. female with left UPJ stone here for cysto, L RPG, L URS/LL, L stent placement.   -The risks, benefits and alternatives of cystoscopy with L URS/LL, L JJ stent placement was discussed with the patient.  Risks include, but are not limited to: bleeding, urinary tract infection, ureteral injury, ureteral stricture disease, chronic pain, urinary symptoms, bladder injury, stent migration, the need for nephrostomy tube placement, MI, CVA, DVT, PE and the inherent  risks with general anesthesia.  The patient voices understanding and wishes to proceed.      Matt R. Opie Fanton MD 03/13/2024, 7:14 AM  [facility] Pager: [phone]): [phone]     [1]  Allergies Allergen Reactions   Sulfa  Antibiotics Rash   Sulfamethoxazole -Trimethoprim      Other reaction(s): Unknown Other reaction(s): Other, Unknown Other reaction(s): Unknown

## 2024-03-13 ENCOUNTER — Encounter (HOSPITAL_COMMUNITY): Payer: Self-pay | Admitting: Urology

## 2024-03-13 ENCOUNTER — Ambulatory Visit (HOSPITAL_COMMUNITY): Payer: Self-pay

## 2024-03-13 ENCOUNTER — Ambulatory Visit (HOSPITAL_COMMUNITY)

## 2024-03-13 ENCOUNTER — Encounter (HOSPITAL_COMMUNITY): Admission: RE | Disposition: A | Payer: Self-pay | Source: Home / Self Care | Attending: Urology

## 2024-03-13 ENCOUNTER — Ambulatory Visit (HOSPITAL_COMMUNITY)
Admission: RE | Admit: 2024-03-13 | Discharge: 2024-03-13 | Disposition: A | Discharge status: Home / Self Care | Attending: Urology | Admitting: Urology

## 2024-03-13 ENCOUNTER — Encounter (HOSPITAL_COMMUNITY): Payer: Self-pay

## 2024-03-13 DIAGNOSIS — N3281 Overactive bladder: Secondary | ICD-10-CM | POA: Diagnosis not present

## 2024-03-13 DIAGNOSIS — Z79899 Other long term (current) drug therapy: Secondary | ICD-10-CM | POA: Insufficient documentation

## 2024-03-13 DIAGNOSIS — N200 Calculus of kidney: Secondary | ICD-10-CM | POA: Diagnosis present

## 2024-03-13 DIAGNOSIS — F418 Other specified anxiety disorders: Secondary | ICD-10-CM | POA: Insufficient documentation

## 2024-03-13 DIAGNOSIS — N289 Disorder of kidney and ureter, unspecified: Secondary | ICD-10-CM | POA: Diagnosis not present

## 2024-03-13 DIAGNOSIS — N132 Hydronephrosis with renal and ureteral calculous obstruction: Secondary | ICD-10-CM | POA: Insufficient documentation

## 2024-03-13 DIAGNOSIS — E039 Hypothyroidism, unspecified: Secondary | ICD-10-CM | POA: Insufficient documentation

## 2024-03-13 DIAGNOSIS — N201 Calculus of ureter: Secondary | ICD-10-CM

## 2024-03-13 HISTORY — PX: CYSTOSCOPY/URETEROSCOPY/HOLMIUM LASER/STENT PLACEMENT: SHX6546

## 2024-03-13 HISTORY — DX: Family history of other specified conditions: Z84.89

## 2024-03-13 HISTORY — PX: CYSTOSCOPY W/ RETROGRADES: SHX1426

## 2024-03-13 SURGERY — CYSTOSCOPY/URETEROSCOPY/HOLMIUM LASER/STENT PLACEMENT
Anesthesia: General | Site: Ureter | Laterality: Left

## 2024-03-13 MED ORDER — DEXAMETHASONE SOD PHOSPHATE PF 10 MG/ML IJ SOLN
INTRAMUSCULAR | Status: DC | PRN
  Administered 2024-03-13: 5 mg via INTRAVENOUS

## 2024-03-13 MED ORDER — LACTATED RINGERS IV SOLN: INTRAVENOUS | Status: DC

## 2024-03-13 MED ORDER — IOHEXOL 300 MG/ML  SOLN
INTRAMUSCULAR | Status: DC | PRN
  Administered 2024-03-13: 5 mL

## 2024-03-13 MED ORDER — FENTANYL CITRATE (PF) 100 MCG/2ML IJ SOLN
INTRAMUSCULAR | Status: AC
  Filled 2024-03-13: qty 2

## 2024-03-13 MED ORDER — DROPERIDOL 2.5 MG/ML IJ SOLN: 0.6250 mg | Freq: Once | INTRAMUSCULAR | Status: DC | PRN

## 2024-03-13 MED ORDER — PROPOFOL 10 MG/ML IV BOLUS
INTRAVENOUS | Status: AC
  Filled 2024-03-13: qty 20

## 2024-03-13 MED ORDER — SODIUM CHLORIDE 0.9 % IR SOLN
Status: DC | PRN
  Administered 2024-03-13: 3000 mL

## 2024-03-13 MED ORDER — FENTANYL CITRATE (PF) 50 MCG/ML IJ SOSY: 25.0000 ug | PREFILLED_SYRINGE | INTRAMUSCULAR | Status: DC | PRN

## 2024-03-13 MED ORDER — OXYCODONE HCL 5 MG PO TABS: 5.0000 mg | ORAL_TABLET | Freq: Once | ORAL | Status: DC | PRN

## 2024-03-13 MED ORDER — ONDANSETRON HCL 4 MG/2ML IJ SOLN
INTRAMUSCULAR | Status: AC
  Filled 2024-03-13: qty 2

## 2024-03-13 MED ORDER — LIDOCAINE HCL URETHRAL/MUCOSAL 2 % EX GEL
CUTANEOUS | Status: AC
  Filled 2024-03-13: qty 5

## 2024-03-13 MED ORDER — PHENYLEPHRINE 80 MCG/ML (10ML) SYRINGE FOR IV PUSH (FOR BLOOD PRESSURE SUPPORT)
PREFILLED_SYRINGE | INTRAVENOUS | Status: AC
  Filled 2024-03-13: qty 10

## 2024-03-13 MED ORDER — DEXMEDETOMIDINE HCL IN NACL 80 MCG/20ML IV SOLN
INTRAVENOUS | Status: AC
  Filled 2024-03-13: qty 20

## 2024-03-13 MED ORDER — ORAL CARE MOUTH RINSE: 15.0000 mL | Freq: Once | OROMUCOSAL | Status: AC

## 2024-03-13 MED ORDER — CHLORHEXIDINE GLUCONATE 0.12 % MT SOLN
15.0000 mL | Freq: Once | OROMUCOSAL | Status: AC
  Administered 2024-03-13: 15 mL via OROMUCOSAL

## 2024-03-13 MED ORDER — LIDOCAINE HCL (CARDIAC) PF 100 MG/5ML IV SOSY
PREFILLED_SYRINGE | INTRAVENOUS | Status: DC | PRN
  Administered 2024-03-13: 70 mg via INTRAVENOUS

## 2024-03-13 MED ORDER — PHENAZOPYRIDINE HCL 200 MG PO TABS: 200.0000 mg | ORAL_TABLET | Freq: Three times a day (TID) | ORAL | 1 refills | Status: AC | PRN

## 2024-03-13 MED ORDER — DEXMEDETOMIDINE HCL IN NACL 80 MCG/20ML IV SOLN
INTRAVENOUS | Status: DC | PRN
  Administered 2024-03-13: 4 ug via INTRAVENOUS
  Administered 2024-03-13: 12 ug via INTRAVENOUS

## 2024-03-13 MED ORDER — EPHEDRINE 5 MG/ML INJ
INTRAVENOUS | Status: AC
  Filled 2024-03-13: qty 5

## 2024-03-13 MED ORDER — FENTANYL CITRATE (PF) 100 MCG/2ML IJ SOLN
INTRAMUSCULAR | Status: DC | PRN
  Administered 2024-03-13 (×2): 25 ug via INTRAVENOUS
  Administered 2024-03-13: 50 ug via INTRAVENOUS

## 2024-03-13 MED ORDER — ONDANSETRON HCL 4 MG/2ML IJ SOLN
INTRAMUSCULAR | Status: DC | PRN
  Administered 2024-03-13: 4 mg via INTRAVENOUS

## 2024-03-13 MED ORDER — PHENYLEPHRINE 80 MCG/ML (10ML) SYRINGE FOR IV PUSH (FOR BLOOD PRESSURE SUPPORT)
PREFILLED_SYRINGE | INTRAVENOUS | Status: DC | PRN
  Administered 2024-03-13 (×7): 80 ug via INTRAVENOUS

## 2024-03-13 MED ORDER — ACETAMINOPHEN 10 MG/ML IV SOLN: 1000.0000 mg | Freq: Once | INTRAVENOUS | Status: DC | PRN

## 2024-03-13 MED ORDER — CEFAZOLIN SODIUM-DEXTROSE 2-4 GM/100ML-% IV SOLN
2.0000 g | INTRAVENOUS | Status: AC
  Administered 2024-03-13: 2 g via INTRAVENOUS
  Filled 2024-03-13: qty 100

## 2024-03-13 MED ORDER — OXYBUTYNIN CHLORIDE ER 10 MG PO TB24: 10.0000 mg | ORAL_TABLET | Freq: Every day | ORAL | 1 refills | Status: AC

## 2024-03-13 MED ORDER — PROPOFOL 10 MG/ML IV BOLUS
INTRAVENOUS | Status: DC | PRN
  Administered 2024-03-13: 50 mg via INTRAVENOUS
  Administered 2024-03-13: 150 mg via INTRAVENOUS
  Administered 2024-03-13 (×2): 50 mg via INTRAVENOUS

## 2024-03-13 MED ORDER — MIDAZOLAM HCL 2 MG/2ML IJ SOLN
INTRAMUSCULAR | Status: AC
  Filled 2024-03-13: qty 2

## 2024-03-13 MED ORDER — ACETAMINOPHEN 10 MG/ML IV SOLN
INTRAVENOUS | Status: DC | PRN
  Administered 2024-03-13: 1000 mg via INTRAVENOUS

## 2024-03-13 MED ORDER — MIDAZOLAM HCL 5 MG/5ML IJ SOLN
INTRAMUSCULAR | Status: DC | PRN
  Administered 2024-03-13: 2 mg via INTRAVENOUS

## 2024-03-13 MED ORDER — LIDOCAINE HCL (PF) 2 % IJ SOLN
INTRAMUSCULAR | Status: AC
  Filled 2024-03-13: qty 5

## 2024-03-13 MED ORDER — OXYCODONE-ACETAMINOPHEN 5-325 MG PO TABS: 1.0000 | ORAL_TABLET | Freq: Two times a day (BID) | ORAL | 0 refills | Status: AC | PRN

## 2024-03-13 MED ORDER — ACETAMINOPHEN 10 MG/ML IV SOLN
INTRAVENOUS | Status: AC
  Filled 2024-03-13: qty 100

## 2024-03-13 MED ORDER — OXYCODONE HCL 5 MG/5ML PO SOLN: 5.0000 mg | Freq: Once | ORAL | Status: DC | PRN

## 2024-03-13 MED ADMIN — Ephedrine Sulfate Prefilled Syringe 25 MG/5ML (5 MG/ML): 5 mg | INTRAVENOUS | NDC 51754425001

## 2024-03-13 MED ADMIN — Ephedrine Sulfate Prefilled Syringe 25 MG/5ML (5 MG/ML): 2.5 mg | INTRAVENOUS | NDC 51754425001

## 2024-03-13 SURGICAL SUPPLY — 25 items
BAG URO CATCHER STRL LF (MISCELLANEOUS) ×2 IMPLANT
BASKET ZERO TIP NITINOL 2.4FR (BASKET) IMPLANT
BENZOIN TINCTURE PRP APPL 2/3 (GAUZE/BANDAGES/DRESSINGS) IMPLANT
CATH URETERAL DUAL LUMEN 10F (MISCELLANEOUS) IMPLANT
CATH URETL OPEN 5X70 (CATHETERS) IMPLANT
CATH URETL OPEN END 6FR 70 (CATHETERS) IMPLANT
CLOTH BEACON ORANGE TIMEOUT ST (SAFETY) ×2 IMPLANT
DRSG TEGADERM 2-3/8X2-3/4 SM (GAUZE/BANDAGES/DRESSINGS) IMPLANT
FIBER LASER MOSES 200 DFL (Laser) IMPLANT
GLOVE BIOGEL M 7.0 STRL (GLOVE) ×2 IMPLANT
GOWN STRL REUS W/ TWL XL LVL3 (GOWN DISPOSABLE) ×2 IMPLANT
GUIDEWIRE STR DUAL SENSOR (WIRE) ×4 IMPLANT
GUIDEWIRE ZIPWRE .038 STRAIGHT (WIRE) IMPLANT
KIT TURNOVER KIT A (KITS) ×2 IMPLANT
MANIFOLD NEPTUNE II (INSTRUMENTS) ×2 IMPLANT
NS IRRIG 1000ML POUR BTL (IV SOLUTION) IMPLANT
PACK CYSTO (CUSTOM PROCEDURE TRAY) ×2 IMPLANT
PAD PREP 24X48 CUFFED NSTRL (MISCELLANEOUS) ×2 IMPLANT
SHEATH DILATOR SET 8/10 (MISCELLANEOUS) IMPLANT
SHEATH NAVIGATOR HD 11/13X28 (SHEATH) IMPLANT
SHEATH NAVIGATOR HD 11/13X36 (SHEATH) IMPLANT
STENT URET 6FRX24 CONTOUR (STENTS) IMPLANT
SYR 30ML LL (SYRINGE) IMPLANT
TUBING CONNECTING 10 (TUBING) ×2 IMPLANT
TUBING UROLOGY SET (TUBING) ×2 IMPLANT

## 2024-03-13 NOTE — Anesthesia Preprocedure Evaluation (Addendum)
"                                    Anesthesia Evaluation  Patient identified by MRN, date of birth, ID band Patient awake    Reviewed: Allergy & Precautions, NPO status , Patient's Chart, lab work & pertinent test results  History of Anesthesia Complications (+) PONV and history of anesthetic complications  Airway Mallampati: I  TM Distance: >3 FB Neck ROM: Full    Dental no notable dental hx. (+) Teeth Intact   Pulmonary neg pulmonary ROS, neg sleep apnea, neg COPD, Patient abstained from smoking.Not current smoker   Pulmonary exam normal breath sounds clear to auscultation       Cardiovascular Exercise Tolerance: Good METS(-) hypertension(-) CAD and (-) Past MI negative cardio ROS (-) dysrhythmias  Rhythm:Regular Rate:Normal - Systolic murmurs    Neuro/Psych  Headaches PSYCHIATRIC DISORDERS Anxiety Depression       GI/Hepatic ,neg GERD  ,,(+)     (-) substance abuse    Endo/Other  neg diabetesHypothyroidism  GLP1ra last taken > 7 days ago  Renal/GU Renal disease     Musculoskeletal   Abdominal   Peds  Hematology   Anesthesia Other Findings Past Medical History: No date: Acne No date: Allergic rhinitis No date: Anxiety No date: Arthritis No date: Depression No date: Depression No date: Erythema     Comment:  nodosum, left lower leg No date: Family history of adverse reaction to anesthesia No date: Female stress incontinence No date: Granuloma annulare No date: Headache No date: Hirsutism No date: History of kidney stones No date: Hyperhidrosis No date: Hypothyroidism No date: Narcolepsy     Comment:  suspected without cataplexy,hypersomnia, MSLT with 4               SREM No date: Nephrolithiasis     Comment:  right No date: Obesity 08/22/2002: Thyroglossal cyst No date: TMJ (dislocation of temporomandibular joint) No date: Varicose veins     Comment:  BOTH LEGS, worsening,painfulk, leg fatigue extreme at               times No  date: Vitamin D deficiency  Reproductive/Obstetrics                              Anesthesia Physical Anesthesia Plan  ASA: 2  Anesthesia Plan: General   Post-op Pain Management: Ofirmev  IV (intra-op)* and Toradol IV (intra-op)*   Induction: Intravenous  PONV Risk Score and Plan: 4 or greater and Ondansetron , Dexamethasone  and Midazolam   Airway Management Planned: Oral ETT  Additional Equipment: None  Intra-op Plan:   Post-operative Plan: Extubation in OR  Informed Consent: I have reviewed the patients History and Physical, chart, labs and discussed the procedure including the risks, benefits and alternatives for the proposed anesthesia with the patient or authorized representative who has indicated his/her understanding and acceptance.     Dental advisory given  Plan Discussed with: CRNA and Surgeon  Anesthesia Plan Comments: (Discussed risks of anesthesia with patient, including PONV, sore throat, lip/dental/eye damage. Rare risks discussed as well, such as cardiorespiratory and neurological sequelae, and allergic reactions. Discussed the role of CRNA in patient's perioperative care. Patient understands.)        Anesthesia Quick Evaluation  "

## 2024-03-13 NOTE — Op Note (Signed)
 Operative Note  Preoperative diagnosis:  1.  Left renal stone  Postoperative diagnosis: 1.  Left renal stone  Procedure(s): 1.  Cystoscopy 2.  Left ureteroscopy with laser lithotripsy and basket extraction of stones 3.  Left retrograde pyelogram 4.  Left ureteral stent placement 5. Fluoroscopy with intraoperative interpretation  Surgeon: Donnice Siad, MD  Assistants:  None  Anesthesia:  General  Complications:  None  EBL:  Minimal  Specimens: 1. Stones for stone analysis (to be done at [facility])  Drains/Catheters: 1.  Left 6Fr x 24cm ureteral stent with tether string  Intraoperative findings:   Cystoscopy demonstrated no suspicious bladder lesions Left retrograde pyelogram with chronic appearing left severe hydronephrosis with dilation of renal pelvis. Left ureteroscopy demonstrated approximately 8 mm left UPJ stone that was successfully fragmented and all fragments basket extracted. Successful left ureteral stent placement.  Indication:  Vanessa Blanchard is a 53 y.o. female with history of a left UPJ stone here for left ureteroscopy with laser lithotripsy and basket extraction of stones.  Description of procedure: After informed consent was obtained from the patient, the patient was identified and taken to the operating room and placed in the supine position.  General anesthesia was administered as well as perioperative IV antibiotics.  At the beginning of the case, a time-out was performed to properly identify the patient, the surgery to be performed, and the surgical site.  Sequential compression devices were applied to the lower extremities at the beginning of the case for DVT prophylaxis.  The patient was then placed in the dorsal lithotomy supine position, prepped and draped in sterile fashion.  Preliminary scout fluoroscopy revealed that there was an 8mm calcification area at the left UPJ, which corresponds to the stone found on the preoperative CT scan. We then  passed the 21-French rigid cystoscope through the urethra and into the bladder under vision without any difficulty, noting a normal urethra without strictures.  A systematic evaluation of the bladder revealed no evidence of any suspicious bladder lesions.  Ureteral orifices were in normal position.    Under cystoscopic and flouroscopic guidance, we cannulated the left ureteral orifice with a 5-French open-ended ureteral catheter and a gentle retrograde pyelogram was performed, revealing a normal caliber ureter without any filling defects. 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 There was a 8mm filling defect in the left UPJ corresponding to the stone. A 0.038 sensor wire was then passed up to the level of the renal pelvis and secured to the drape as a safety wire. The ureteral catheter and cystoscope were removed, leaving the safety wire in place.   A semi-rigid ureteroscope was passed alongside the wire up the distal ureter which appeared normal. A second 0.038 sensor wire was passed under direct vision and the semirigid scope was removed. A 11/13Fr ureteral access sheath was carefully advanced up the ureter to the level of the UPJ over this wire under fluoroscopic guidance. The flexible ureteroscope was advanced into the collecting system via the access sheath. The collecting system was inspected. The calculus was identified at the left UPJ. Using the 272 micron holmium laser fiber, the stone was fragmented completely. A 2.2 Fr zero tip basket was used to remove the fragments under visual guidance. These were sent for chemical analysis. With the ureteroscope in the kidney, a gentle pyelogram was performed to delineate the calyceal system and we evaluated the calyces systematically. We encountered no further stones. The rest of the stone fragments were very  tiny and these were  irrigated away gently. The calyces were re-inspected and there were no significant stone fragment  residual.   We then withdrew the ureteroscope back down the ureter along with the access sheath, noting no evidence of any stones along the course of the ureter.  Prior to removing the ureteroscope, we did pass the Glidewire back up to the ureter to the renal pelvis.  Once the ureteroscope was removed, we then used the Glidewire under fluoroscopic guidance and passed up a 6-French x 24 cm double-pigtail ureteral stent up the ureter, making sure that the proximal and distal ends coiled within the kidney and bladder respectively.  Once the ureteroscope was removed, the Glidewire was backloaded through the rigid cystoscope, which was then advanced down the urethra and into the bladder. We then used the Glidewire under direct vision through the rigid cystoscope and under fluoroscopic guidance and passed up a 6-French, 24 cm double-pigtail ureteral stent up ureter, making sure that the proximal and distal ends coiled within the kidney and bladder respectively.  Note that we left a long tether string attached to the distal end of the ureteral stent and it exited the urethral meatus and was secured to the inner thigh with a tegaderm adhesive.  The cystoscope was then advanced back into the bladder under vision.  We were able to see the distal stent coiling nicely within the bladder.  The bladder was then emptied with irrigation solution.  The cystoscope was then removed.    The patient tolerated the procedure well and there was no complication. Patient was awoken from anesthesia and taken to the recovery room in stable condition. I was present and scrubbed for the entirety of the case.  Plan:  Patient will be discharged home.  She will remove stent in 3 to 4 days.  She will follow-up as scheduled in 1 month with ultrasound prior.   Matt R. Brolin Dambrosia MD [facility]  Pager: [phone]

## 2024-03-13 NOTE — Anesthesia Postprocedure Evaluation (Signed)
"   Anesthesia Post Note  Patient: Vanessa Blanchard  Procedure(s) Performed: CYSTOSCOPY/URETEROSCOPY/HOLMIUM LASER/STENT PLACEMENT (Left: Ureter) CYSTOSCOPY, WITH RETROGRADE PYELOGRAM (Left)     Patient location during evaluation: PACU Anesthesia Type: General Level of consciousness: awake and alert Pain management: pain level controlled Vital Signs Assessment: post-procedure vital signs reviewed and stable Respiratory status: spontaneous breathing, nonlabored ventilation, respiratory function stable and patient connected to nasal cannula oxygen Cardiovascular status: blood pressure returned to baseline and stable Postop Assessment: no apparent nausea or vomiting Anesthetic complications: no   No notable events documented.  Last Vitals:  Vitals:   03/13/24 0929 03/13/24 0930  BP: 115/71 (!) 112/48  Pulse: 71 86  Resp: 12 12  Temp: 36.7 C   SpO2: 95% 97%    Last Pain:  Vitals:   03/13/24 0929  TempSrc:   PainSc: 0-No pain                 Rome Ade      "

## 2024-03-13 NOTE — Anesthesia Procedure Notes (Signed)
 Procedure Name: LMA Insertion Date/Time: 03/13/2024 8:24 AM  Performed by: Belvie Valri NOVAK, CRNAPre-anesthesia Checklist: Patient identified, Emergency Drugs available, Suction available and Patient being monitored Patient Re-evaluated:Patient Re-evaluated prior to induction Oxygen Delivery Method: Circle System Utilized Preoxygenation: Pre-oxygenation with 100% oxygen Induction Type: IV induction LMA: LMA inserted LMA Size: 3.0 Number of attempts: 1 Airway Equipment and Method: Bite block Placement Confirmation: positive ETCO2 Tube secured with: Tape Dental Injury: Teeth and Oropharynx as per pre-operative assessment

## 2024-03-13 NOTE — Discharge Instructions (Signed)
 [facility] [phone] Post Ureteroscopy With or Without Stent Instructions  Definitions:  Ureter: The duct that transports urine from the kidney to the bladder. Stent:   A plastic hollow tube that is placed into the ureter, from the kidney to the bladder to prevent the ureter from swelling shut.  GENERAL INSTRUCTIONS:  Despite the fact that no skin incisions were used, the area around the ureter and bladder is raw and irritated. The stent is a foreign body which will further irritate the bladder wall. This irritation is manifested by increased frequency of urination, both day and night, and by an increase in the urge to urinate. In some, the urge to urinate is present almost always. Sometimes the urge is strong enough that you may not be able to stop yourself from urinating. The only real cure is to remove the stent and then give time for the bladder wall to heal which can't be done until the danger of the ureter swelling shut has passed, which varies.  You may see some blood in your urine while the stent is in place and a few days afterwards. Do not be alarmed, even if the urine was clear for a while. Get off your feet and drink lots of fluids until clearing occurs. If you start to pass clots or don't improve, call us .  DIET: You may return to your normal diet immediately. Because of the raw surface of your bladder, alcohol, spicy foods, acid type foods and drinks with caffeine may cause irritation or frequency and should be used in moderation. To keep your urine flowing freely and to avoid constipation, drink plenty of fluids during the day ( 8-10 glasses ). Tip: Avoid cranberry juice because it is very acidic.  ACTIVITY: Your physical activity doesn't need to be restricted. However, if you are very active, you may see some blood in your urine. We suggest that you reduce your activity under these circumstances until the bleeding has stopped.  BOWELS: It is important to  keep your bowels regular during the postoperative period. Straining with bowel movements can cause bleeding. A bowel movement every other day is reasonable. Use a mild laxative if needed, such as Milk of Magnesia 2-3 tablespoons, or 2 Dulcolax tablets. Call if you continue to have problems. If you have been taking narcotics for pain, before, during or after your surgery, you may be constipated. Take a laxative if necessary.   MEDICATION: You should resume your pre-surgery medications unless told not to. In addition you will often be given an antibiotic to prevent infection. These should be taken as prescribed until the bottles are finished unless you are having an unusual reaction to one of the drugs.  PROBLEMS YOU SHOULD REPORT TO US : Fevers over 100.5 Fahrenheit. Heavy bleeding, or clots ( See above notes about blood in urine ). Inability to urinate. Drug reactions ( hives, rash, nausea, vomiting, diarrhea ). Severe burning or pain with urination that is not improving.  FOLLOW-UP: You will need a follow-up appointment to monitor your progress. Call for this appointment at the number listed above. Usually the first appointment will be about three to fourteen days after your surgery.  You have a stent attached to a string and may remove this in 3 days

## 2024-03-13 NOTE — Transfer of Care (Signed)
 Immediate Anesthesia Transfer of Care Note  Patient: Vanessa Blanchard  Procedure(s) Performed: CYSTOSCOPY/URETEROSCOPY/HOLMIUM LASER/STENT PLACEMENT (Left: Ureter) CYSTOSCOPY, WITH RETROGRADE PYELOGRAM (Left)  Patient Location: PACU  Anesthesia Type:General  Level of Consciousness: awake  Airway & Oxygen Therapy: Patient Spontanous Breathing  Post-op Assessment: Report given to RN and Post -op Vital signs reviewed and stable  Post vital signs: Reviewed and stable  Last Vitals:  Vitals Value Taken Time  BP 115/71 03/13/24 09:27  Temp    Pulse 86 03/13/24 09:29  Resp 10 03/13/24 09:29  SpO2 97 % 03/13/24 09:29  Vitals shown include unfiled device data.  Last Pain:  Vitals:   03/13/24 0650  TempSrc:   PainSc: 0-No pain         Complications: No notable events documented.

## 2024-03-14 ENCOUNTER — Encounter (HOSPITAL_COMMUNITY): Payer: Self-pay | Admitting: Urology
